# Patient Record
Sex: Male | Born: 1970 | Race: White | Hispanic: No | Marital: Married | Smoking: Never smoker
Health system: Southern US, Community
[De-identification: ages and names within clinical notes are randomized; demographics above are authoritative.]

## PROBLEM LIST (undated history)

## (undated) DIAGNOSIS — J302 Other seasonal allergic rhinitis: Secondary | ICD-10-CM

## (undated) DIAGNOSIS — K4090 Unilateral inguinal hernia, without obstruction or gangrene, not specified as recurrent: Secondary | ICD-10-CM

## (undated) DIAGNOSIS — E039 Hypothyroidism, unspecified: Secondary | ICD-10-CM

## (undated) DIAGNOSIS — E271 Primary adrenocortical insufficiency: Secondary | ICD-10-CM

## (undated) DIAGNOSIS — F419 Anxiety disorder, unspecified: Secondary | ICD-10-CM

## (undated) DIAGNOSIS — K219 Gastro-esophageal reflux disease without esophagitis: Secondary | ICD-10-CM

## (undated) HISTORY — DX: Primary adrenocortical insufficiency: E27.1

## (undated) HISTORY — DX: Hypothyroidism, unspecified: E03.9

## (undated) HISTORY — DX: Gastro-esophageal reflux disease without esophagitis: K21.9

## (undated) HISTORY — PX: RHINOPLASTY: SHX2354

---

## 2002-12-01 ENCOUNTER — Inpatient Hospital Stay (HOSPITAL_COMMUNITY): Admission: EM | Admit: 2002-12-01 | Discharge: 2002-12-09 | Payer: Self-pay | Admitting: Emergency Medicine

## 2002-12-01 ENCOUNTER — Encounter: Payer: Self-pay | Admitting: Internal Medicine

## 2002-12-01 ENCOUNTER — Encounter: Payer: Self-pay | Admitting: Emergency Medicine

## 2002-12-02 ENCOUNTER — Encounter: Payer: Self-pay | Admitting: Internal Medicine

## 2002-12-03 ENCOUNTER — Encounter: Payer: Self-pay | Admitting: Internal Medicine

## 2002-12-23 ENCOUNTER — Encounter: Admission: RE | Admit: 2002-12-23 | Discharge: 2002-12-23 | Payer: Self-pay | Admitting: Internal Medicine

## 2003-01-08 ENCOUNTER — Ambulatory Visit (HOSPITAL_COMMUNITY): Admission: RE | Admit: 2003-01-08 | Discharge: 2003-01-08 | Payer: Self-pay | Admitting: Internal Medicine

## 2003-01-14 ENCOUNTER — Encounter: Payer: Self-pay | Admitting: Internal Medicine

## 2003-01-14 ENCOUNTER — Ambulatory Visit (HOSPITAL_COMMUNITY): Admission: RE | Admit: 2003-01-14 | Discharge: 2003-01-14 | Payer: Self-pay | Admitting: Internal Medicine

## 2003-01-16 ENCOUNTER — Encounter: Admission: RE | Admit: 2003-01-16 | Discharge: 2003-01-16 | Payer: Self-pay | Admitting: Internal Medicine

## 2003-01-23 ENCOUNTER — Encounter: Admission: RE | Admit: 2003-01-23 | Discharge: 2003-01-23 | Payer: Self-pay | Admitting: Internal Medicine

## 2004-12-10 ENCOUNTER — Encounter (INDEPENDENT_AMBULATORY_CARE_PROVIDER_SITE_OTHER): Payer: Self-pay | Admitting: *Deleted

## 2004-12-10 ENCOUNTER — Ambulatory Visit (HOSPITAL_BASED_OUTPATIENT_CLINIC_OR_DEPARTMENT_OTHER): Admission: RE | Admit: 2004-12-10 | Discharge: 2004-12-10 | Payer: Self-pay | Admitting: *Deleted

## 2004-12-10 ENCOUNTER — Ambulatory Visit (HOSPITAL_COMMUNITY): Admission: RE | Admit: 2004-12-10 | Discharge: 2004-12-10 | Payer: Self-pay | Admitting: *Deleted

## 2005-07-11 ENCOUNTER — Encounter: Admission: RE | Admit: 2005-07-11 | Discharge: 2005-07-11 | Payer: Self-pay | Admitting: Endocrinology

## 2006-02-04 IMAGING — CR DG HIP (WITH OR WITHOUT PELVIS) 2-3V*L*
2 series · 2 of 2 positions shown · non-contrast
Comparison: none

CLINICAL DATA: Lumbar spine pain.   Hip pain.   
 LUMBAR SPINE ? 4 VIEW:

[t hip ap right]
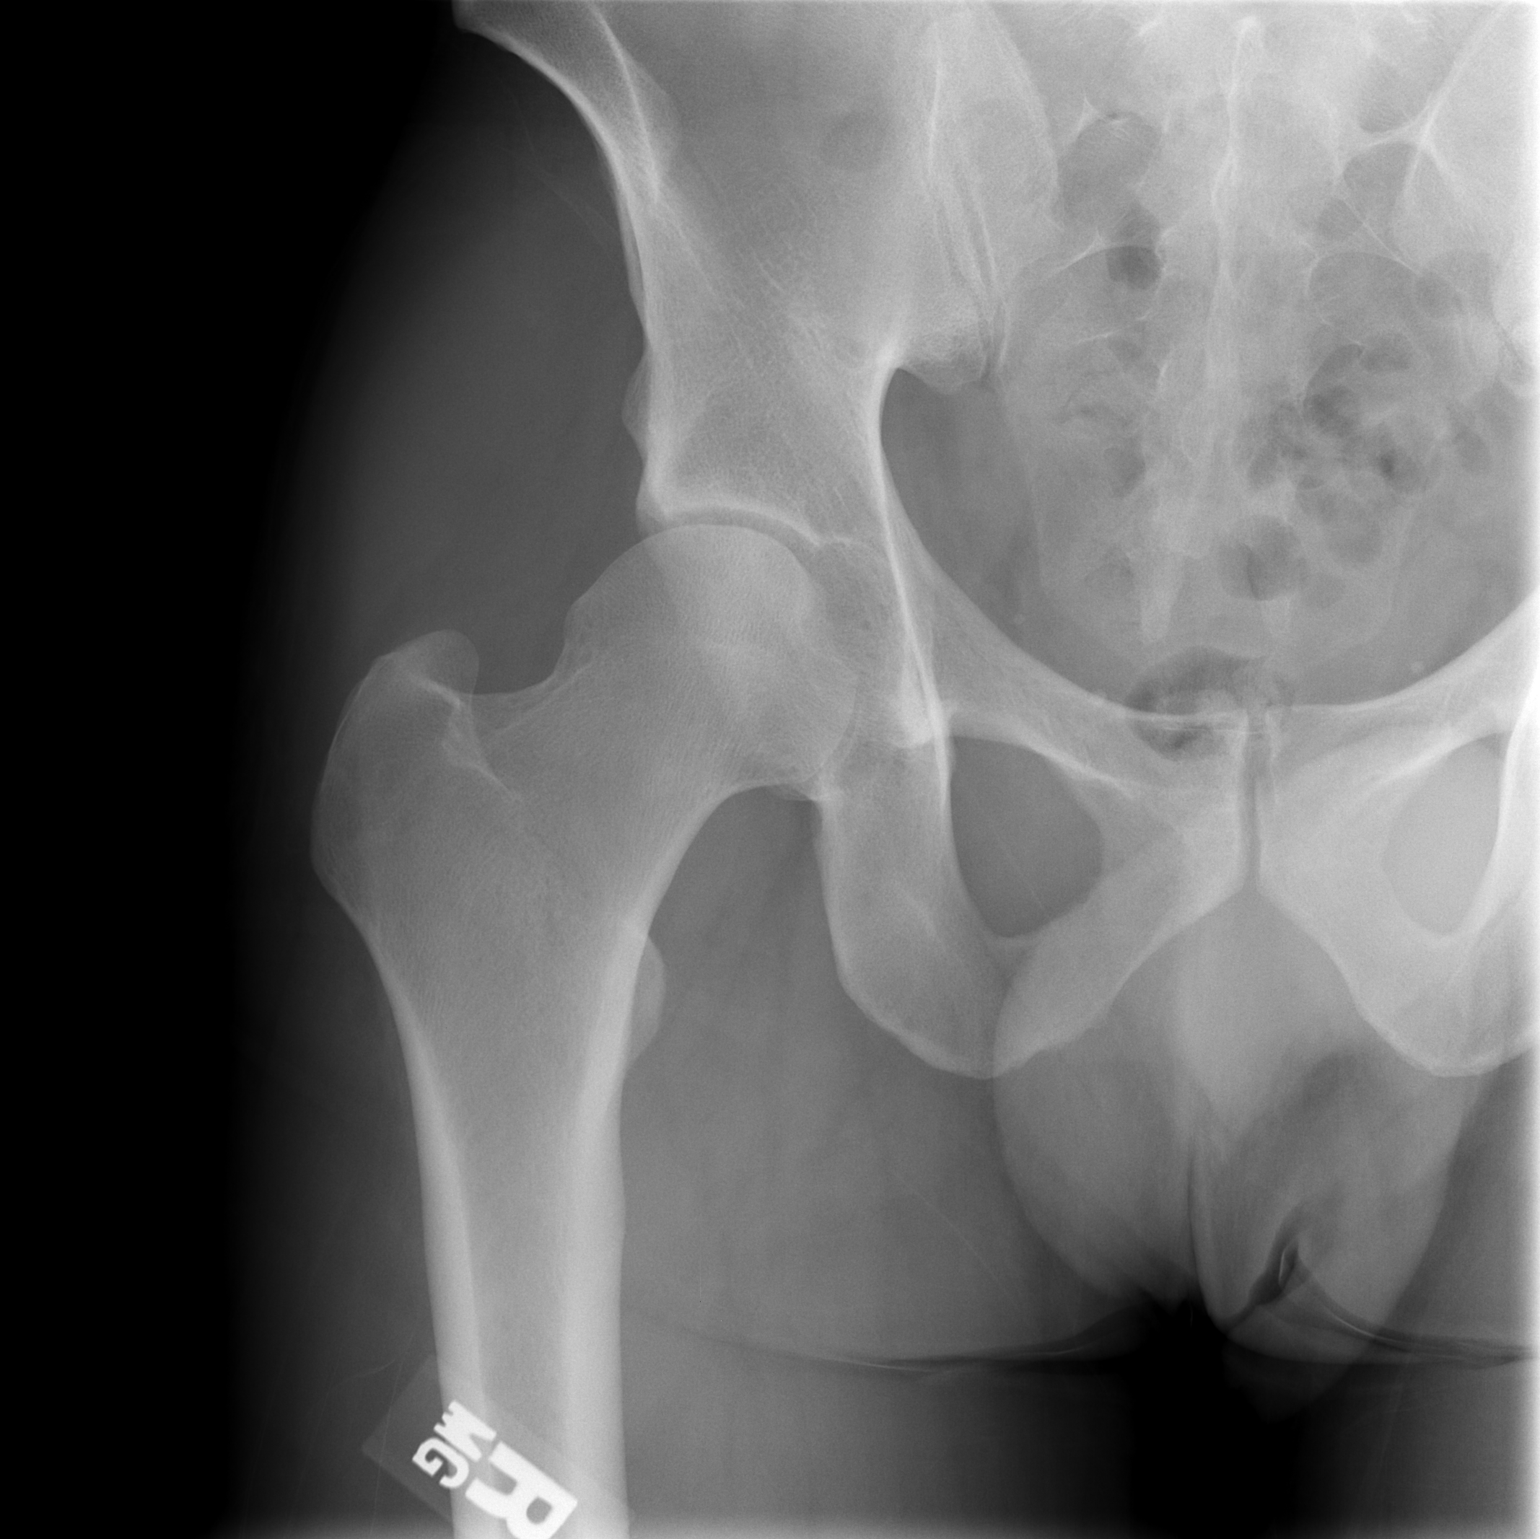

[t hip frog leg right]
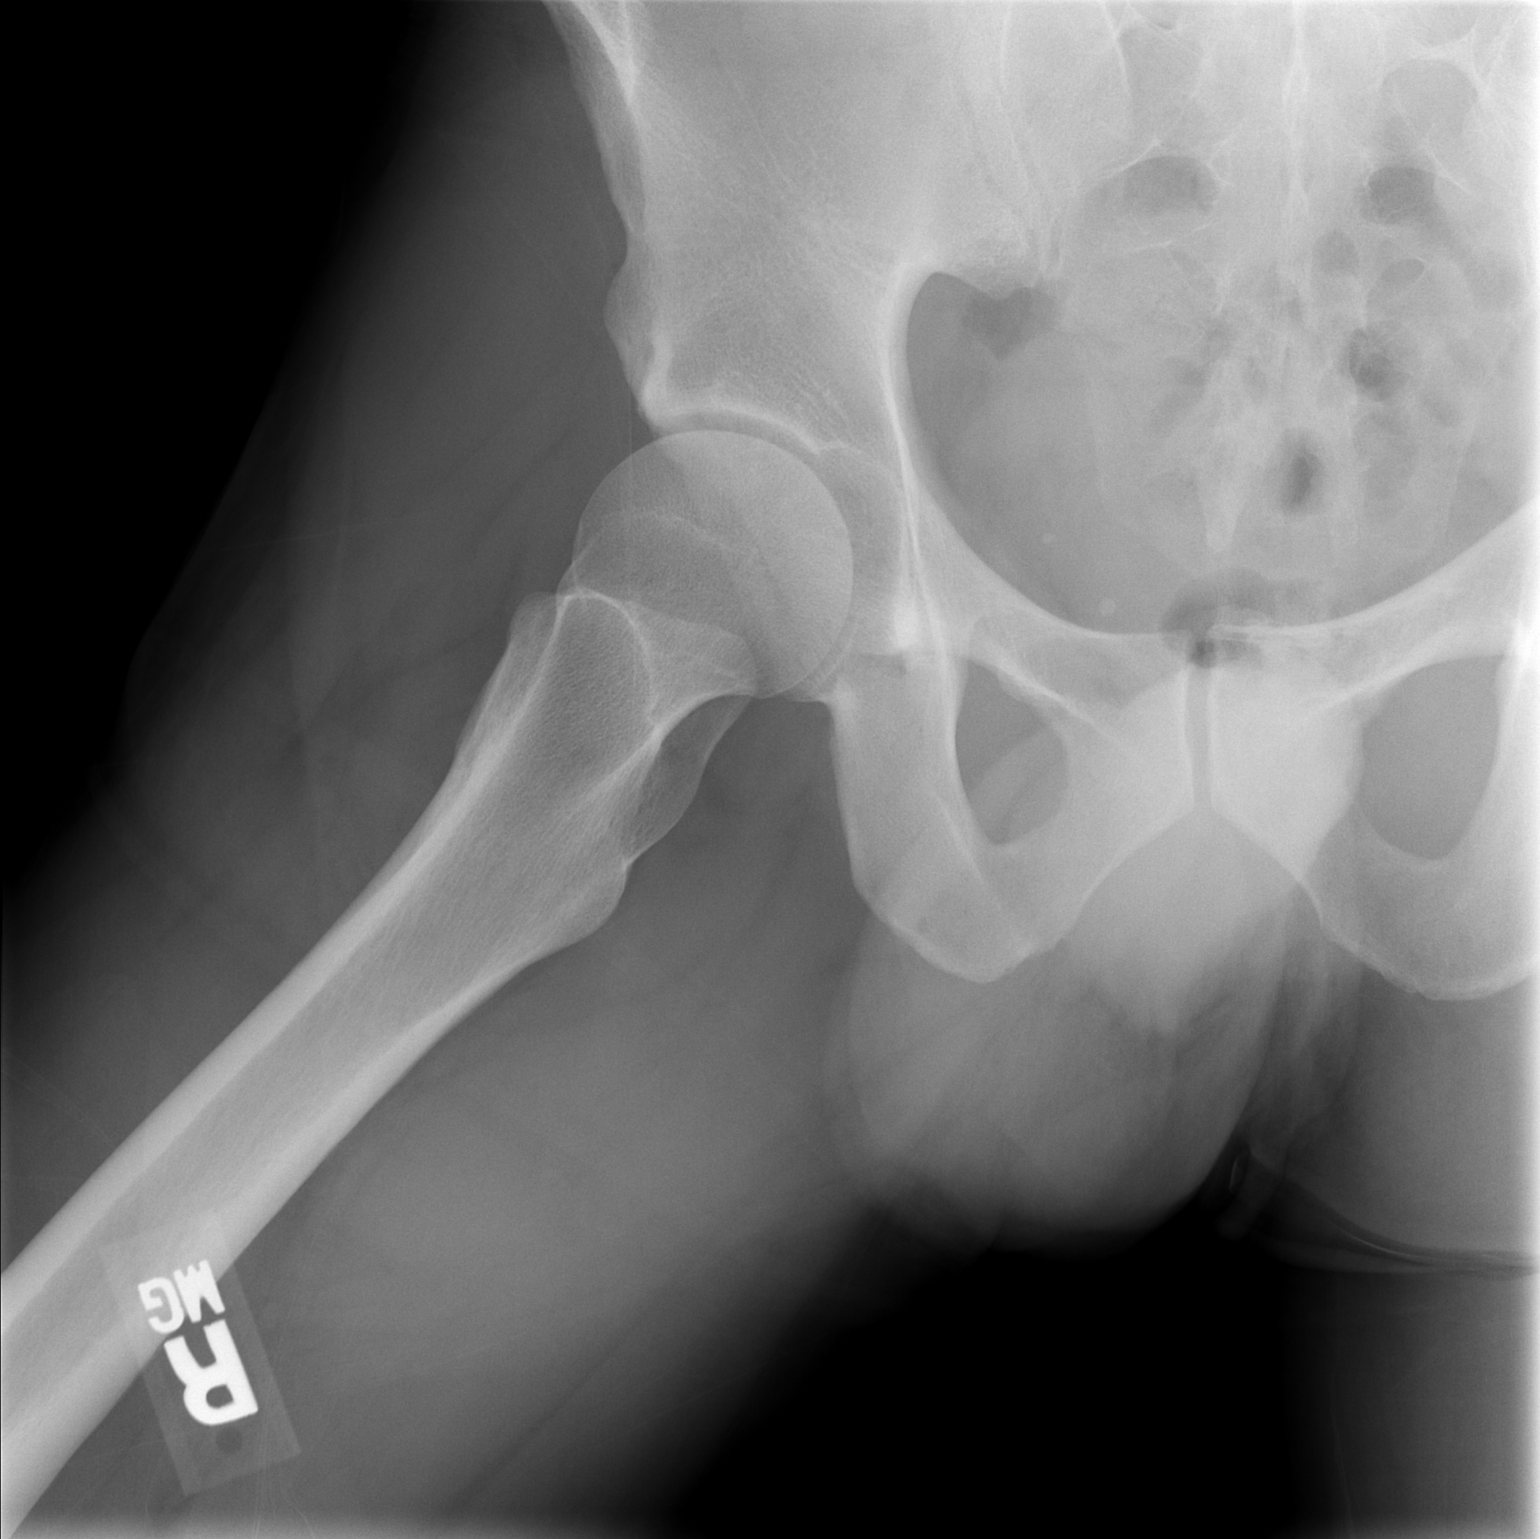

[2 of 2 positions shown; findings below may reference images not displayed]

FINDINGS: The lumbar vertebrae are in normal alignment with normal intervertebral disc spaces.  No compression deformity is seen.  The SI joints appear normal.
IMPRESSION: Negative lumbar spine.  Normal alignment. 
 LEFT HIP ? 2 VIEW:
FINDINGS: Two views of the left hip show no acute abnormality.  The hip joint space is normal and the ramus is intact.
IMPRESSION: Negative left hip.
 RIGHT HIP ? 2 VIEW:
FINDINGS: Two views of the right hip show no acute abnormality.  The joint space is within normal limits and the ramus is intact.
IMPRESSION: Negative right hip.
 PELVIS ? 1 VIEW:
FINDINGS: A single view of the pelvis shows both hips to be in normal position.  The pelvic rami are intact.  The SI joints are normal.
IMPRESSION: Negative pelvis.

## 2008-09-16 ENCOUNTER — Ambulatory Visit: Payer: Self-pay | Admitting: Internal Medicine

## 2008-09-16 ENCOUNTER — Telehealth: Payer: Self-pay | Admitting: Internal Medicine

## 2008-09-16 DIAGNOSIS — R079 Chest pain, unspecified: Secondary | ICD-10-CM

## 2008-09-16 DIAGNOSIS — R1013 Epigastric pain: Secondary | ICD-10-CM | POA: Insufficient documentation

## 2008-09-17 ENCOUNTER — Ambulatory Visit: Payer: Self-pay | Admitting: Internal Medicine

## 2009-09-30 ENCOUNTER — Encounter: Payer: Self-pay | Admitting: Internal Medicine

## 2009-10-01 ENCOUNTER — Encounter: Payer: Self-pay | Admitting: Internal Medicine

## 2009-10-02 ENCOUNTER — Ambulatory Visit: Payer: Self-pay

## 2009-10-02 ENCOUNTER — Ambulatory Visit: Payer: Self-pay | Admitting: Internal Medicine

## 2009-10-02 ENCOUNTER — Ambulatory Visit (HOSPITAL_COMMUNITY): Admission: RE | Admit: 2009-10-02 | Discharge: 2009-10-02 | Payer: Self-pay | Admitting: Internal Medicine

## 2009-10-02 DIAGNOSIS — R002 Palpitations: Secondary | ICD-10-CM | POA: Insufficient documentation

## 2009-10-15 ENCOUNTER — Ambulatory Visit: Payer: Self-pay | Admitting: Internal Medicine

## 2009-11-03 ENCOUNTER — Telehealth: Payer: Self-pay | Admitting: Internal Medicine

## 2009-12-24 ENCOUNTER — Encounter: Payer: Self-pay | Admitting: Internal Medicine

## 2010-09-09 NOTE — Letter (Signed)
Summary: Office Note  Office Note   Imported By: Roderic Ovens 10/08/2009 11:08:47  _____________________________________________________________________  External Attachment:    Type:   Image     Comment:   External Document

## 2010-09-09 NOTE — Assessment & Plan Note (Signed)
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 No h/o heart disease.   On Wednesday, driving to pick up his son. Very busy day. Had been to gym 1 hour before for light workout. Then while driving rapid onset of chest tightness down to L arm. +tachypalpitations.  Diaphoretic and mild nausea. No lightheadedness. Got home and took a dose of hydrocortisone. Brother (who is PA) came over and checked BP and it was normal. HR was just about 100. Went to [facility] and had ECG which was norm with SR 90. Chest tightness resolved fairly quickly. However, HR seemed to stay elevated. Still feel like HR is up and down. Not sure if it may be anxiety. Under stress at work has to adjust hydrocortisone to correlate to stress levels.  Very active at baseline without any CP or SOB. Does mostly resistance exercise but does do some biking. No recent long travel. No diarrhea, fevers or chills. Has been taking Zyrtec-D which he takes all the time. Has brother with panic attacks.   Saw Dr. Evlyn Kanner yesterday and had blood drawn and ECG which was normal (HR around 90-95).   Problems Prior to Update: 1)  Tachycardia  (ICD-785) 2)  Chest Pain  (ICD-786.50) 3)  Abdominal Pain, Epigastric  (ICD-789.06)  Medications Prior to Update: 1)  Hydrocortisone 20 Mg Tabs (Hydrocortisone) .... Every Morning. 2)  Hydrocortisone 10 Mg Tabs (Hydrocortisone) .... Every Evening 3)  Fludrocortisone Acetate 0.1 Mg Tabs (Fludrocortisone Acetate) .... Once Daily 4)  Zyrtec Allergy 10 Mg Tabs (Cetirizine Hcl) .... As Needed 5)  Synthroid 150 Mcg Tabs (Levothyroxine Sodium) .... Once Daily 6)  Prilosec Otc 20 Mg Tbec (Omeprazole Magnesium) .... Once Daily  Current Medications (verified): 1)  Hydrocortisone 20 Mg Tabs (Hydrocortisone) .... Every Morning. 2)  Hydrocortisone 10 Mg Tabs (Hydrocortisone) .... Every  Evening 3)  Fludrocortisone Acetate 0.1 Mg Tabs (Fludrocortisone Acetate) .... Once Daily 4)  Zyrtec Allergy 10 Mg Tabs (Cetirizine Hcl) .... As Needed 5)  Synthroid 150 Mcg Tabs (Levothyroxine Sodium) .... Once Daily  Allergies (verified): 1)  ! Augmentin  Past History:  Past Surgical History: Last updated: 09/16/2008 Rhinoplasty 1970  Family History: Last updated: 09/16/2008 No FH of Colon Cancer: Epilepsy: Brother  Social History: Last updated: 09/16/2008 Occupation: Emergency planning/management officer Patient has never smoked.  Alcohol Use - yes -occ. Daily Caffeine Use -1 Illicit Drug Use - no Patient gets regular exercise.  Past Medical History: 1. GERD 2. Hypothyroidism 3. Addison's disease dx'd in 2004    --h/o adrenal crisis in 2004  Vital Signs:  Patient profile:   40 year old male Height:      70 inches Weight:      185 pounds BMI:     26.64 Pulse rate:   92 / minute Resp:     16 per minute BP sitting:   144 / 84  (left arm)  Vitals Entered By: Marrion Coy, CNA (October 02, 2009 9:19 AM)  Physical Exam  General:  Gen: muscular. healthy appearing. well appearing. no resp difficulty HEENT: normal Neck: supple. no JVD. Carotids 2+ bilat; no bruits. No lymphadenopathy or thryomegaly appreciated. Cor: PMI nondisplaced. Regular rate & rhythm. No rubs, gallops, murmur. Lungs: clear Abdomen: soft, nontender, nondistended. No hepatosplenomegaly. No bruits or masses. Good bowel sounds. Extremities: no cyanosis, clubbing, rash, edema Neuro: alert & orientedx3, cranial nerves grossly intact. moves all  4 extremities w/o difficulty. affect pleasant    Impression & Recommendations:  Problem # 1:  CHEST TIGHTNESS-PRESSURE-OTHER ([id number]) Unclear etiology. No objective signs ischemia but may be related to tachypalpitations or possibly anxiety. Will schedule echo and treadmill test to evaluate. Start low-dose klonopin 0.25 two times a day.  Problem # 2:  PALPITATIONS  (ICD-785.1) TSH checked by Dr. Evlyn Kanner. Will check 48-our holter and echo.   Other Orders: Treadmill (Treadmill) Holter Monitor (Holter Monitor)  Patient Instructions: 1)  Your physician has requested that you have an exercise tolerance test.  For further information please visit [email or web address]  Please also follow instruction sheet, as given. 2)  Your physician has recommended that you wear a holter monitor.  Holter monitors are medical devices that record the heart's electrical activity. Doctors most often use these monitors to diagnose arrhythmias. Arrhythmias are problems with the speed or rhythm of the heartbeat. The monitor is a small, portable device. You can wear one while you do your normal daily activities. This is usually used to diagnose what is causing palpitations/syncope (passing out). 3)  Follow up in 1 month Prescriptions: KLONOPIN 0.5 MG TABS (CLONAZEPAM) 1/2 tab two times a day  #30 x 3   Entered by:   Meredith Staggers, RN   Authorized by:   Dolores Patty, MD, [facility]   Signed by:   Meredith Staggers, RN on 10/02/2009   Method used:   Print then Give to Patient   RxID:   [id number]

## 2010-09-09 NOTE — Letter (Signed)
Summary: cx'd stress test  [facility], Main Office  [address]   [place], [place] [address]   Phone: [phone]  Fax: [phone]        Dec 24, 2009 MRN: [id number]    Mark Christian [address] [place], [place]  [address]    Dear Mark Christian,  Our records indicate that you have cancelled your stress test.  It is Dr Treyson Axel's recommendation that you have this test done.  Please contact our office as soon as possible to reschedule.     Sincerely,  Meredith Staggers, RN Arvilla Meres, MD  This letter has been electronically signed by your physician.

## 2010-09-09 NOTE — Progress Notes (Signed)
Summary: pt calling for holter results  Phone Note Call from Patient Call back at Home Phone [phone]   Caller: PT Reason for Call: Lab or Test Results Summary of Call: PT calling for holter monitor results. Initial call taken by: Faythe Ghee,  November 03, 2009 10:02 AM  Follow-up for Phone Call        pt given results, SR w/occ. PAC/PVC's Meredith Staggers, RN  November 03, 2009 2:05 PM

## 2010-10-14 ENCOUNTER — Other Ambulatory Visit: Payer: Self-pay | Admitting: Endocrinology

## 2010-10-14 DIAGNOSIS — R599 Enlarged lymph nodes, unspecified: Secondary | ICD-10-CM

## 2010-10-15 ENCOUNTER — Ambulatory Visit
Admission: RE | Admit: 2010-10-15 | Discharge: 2010-10-15 | Disposition: A | Discharge status: Home / Self Care | Payer: 59 | Source: Ambulatory Visit | Attending: Endocrinology | Admitting: Endocrinology

## 2010-10-15 DIAGNOSIS — R599 Enlarged lymph nodes, unspecified: Secondary | ICD-10-CM

## 2010-10-15 MED ORDER — IOHEXOL 300 MG/ML  SOLN
100.0000 mL | Freq: Once | INTRAMUSCULAR | Status: AC | PRN
  Administered 2010-10-15: 100 mL via INTRAVENOUS

## 2010-12-24 NOTE — Op Note (Signed)
Mark Christian, Mark Christian NO.:  [id number]   MEDICAL RECORD NO.:  [id number]          PATIENT TYPE:  AMB   LOCATION:  DSC                          FACILITY:  [facility]   PHYSICIAN:  Kathy Breach, M.D.      DATE OF BIRTH:  [birth year]   DATE OF PROCEDURE:  12/10/2004  DATE OF DISCHARGE:                                 OPERATIVE REPORT   PREOPERATIVE DIAGNOSES:  1.  Recurrent and chronic bilateral maxillary sinusitis.  2.  Addison's disease.   POSTOPERATIVE DIAGNOSES:  1.  Recurrent and chronic bilateral maxillary sinusitis.  2.  Addison's disease.   PROCEDURES:  1.  Bilateral anterior ethmoidectomy with osteomeatal windows.  2.  Endoscopic removal right concha bullosa cell.   DESCRIPTION OF PROCEDURE:  Attempting the procedure under local anesthesia  with monitored anesthesia coverage.  Patient being sedated, was unable to  remain adequately still after early nasal anesthesia had been applied with  4% Xylocaine, ephedrine soaked probes to the sphenopalatine and anterior  ethmoid nerve areas bilaterally.  Anesthesia was converted to general  orotracheal anesthesia under the direction of the anesthesiologist.  With  patient under general orotracheal anesthesia, probes to the sphenopalatine  and anterior ethmoid nerve areas soaked with 4% Xylocaine, 1:100,000  epinephrine solution again placed.  Cotton pledgets soaked in similar  solution were inserted along the middle and inferior turbinates bilaterally.  Mucosa of middle meatus and meatal surface of the inferior turbinate  infiltrated with 1% Xylocaine with 1:100,000 epinephrine.  Patient had  moderate deflection of the nasal septum to the left in the middle meatal  area narrowing the area but not obstructing the significant deviation.  Small middle turbinate identified the infundibulum, identified sickle cell  made to incise along the inferior leading edge of the infundibulum which was  removed.  __________ and  visible prior to removing the infundibulum was  prominent bulla ethmoidalis cell which was taken down with straight Weil  forceps.  Once the bulla ethmoidalis cell was removed, the area of the  middle meatal ostium could be identified by palpation with curved suction.  A definite opening in the ostium was not visible.  A window was then made  through the ostial meatal.  A good 1 cm window into the left maxillary  sinus.  Somewhat thickened mucosa of the area but placing the curved suction  deep into the sinus, no returns obtained.  On the left side, it encountered  as a prominent middle turbinate with a known conchal bullosa cell.  Sickle  cell inserted into the concha bullosa cell and the lower 180 degrees  excised.  The meatal surface of the concha bullosa cell removed  marsupializing the cell.  Again, as on the opposite side, incision was made  along the inferior and leading edge of the infundibulum which was removed.  Again, a very prominent concha bullosa cell was taken down with Weil  forceps.  The area of the ostium, again no definite ostium identified but by  palpation and curved suction, the membranous area was identified and a  window again around 1  cm, made into the right maxillary sinus.  Placing the  curved suction into the depth of the sinus, again no return at this time  received for any cultures.  Cotton pledgets inserted in the middle meatal  area bilaterally for a few minutes and removed.  There was no  significant bleeding.  Nasopharynx and oropharynx were suctioned clear of  scant amount of blood.  Blood loss for the procedure was less than 30 to 50  mL.  The patient tolerated the procedure well and was taken to the recovery  room in stable general condition.      JGL/MEDQ  D:  12/10/2004  T:  12/10/2004  Job:  [id number]

## 2010-12-24 NOTE — Discharge Summary (Signed)
NAMELOYED, WILMES NO.:  [id number]   MEDICAL RECORD NO.:  [id number]                   PATIENT TYPE:  INP   LOCATION:  5740                                 FACILITY:  [facility]   PHYSICIAN:  Rodolph Bong, M.D.                  DATE OF BIRTH:  [birth year]   DATE OF ADMISSION:  12/01/2002  DATE OF DISCHARGE:  12/09/2002                                 DISCHARGE SUMMARY   DISCHARGE DIAGNOSES:  1. Sepsis.  2. Anemia.   DISCHARGE MEDICATIONS:  1. Hydrocortisone 5 mg.  The patient was advised to take two tablets in the     morning and then one tablet the following day to complete the course.  2. Hydrochlorothiazide (HCTZ) 12.5 mg p.o. q.d. while taking hydrocortisone.  3. Doxycycline 100 mg p.o. q. 12h x 3 days.  4. Tequin 400 mg p.o. q.d. x 3 days.  5. Protonix 40 mg p.o. q.d.   BRIEF ADMISSION HISTORY:  This is a 40 year old white male who presents via  EMS, status post an acute episode of coughing up black tarry substance this  morning with subsequent altered mental status.  Wife states he seemed to be  fine until the a.m. of November 30, 2002, then he began having several episodes  of nausea, vomiting, and diarrhea.  Described large volume of diarrhea, but  no blood.  His family member had some Phenergan on hand, so he took two  tablets which alleviated symptoms yesterday.  His wife awoke this morning  and found Mr. Koike vomiting a black tarry substance.  At that point in  time he became disoriented, did not recognize family members, etc.  EMS was  called and transported him to [facility] Emergency Department.  The ED staff  noted the patient to be cool to touch, cyanotic nailbed, and sluggish  pupils.  His initial vitals showed a temperature of 104.7, blood pressure  60/30, heart rate of 163.  He was given I.V. fluids wide open and Zosyn  empirically.  We were called to admit and further evaluate Mr. Murchison.   BRIEF HOSPITAL COURSE:  1. Sepsis.   Mr. Tarnowski presents with a history as outlined above.  He was in     septic shock with systemic inflammatory response upon admission.     Significant laboratory data revealed a creatinine of 3.9 with a BUN of     39.  In addition, his INR was elevated at 1.7.  His PT was slightly     elevated at 38.  He was admitted and switched to vancomycin and Rocephin     for empiric antibiotic coverage.  In addition, LP, blood, and urine     cultures were obtained.  In addition shortly thereafter, doxycycline was     added to his antibiotic regimen for concern over the possible MRSA.     While in the hospital,  Mr. Teicher experienced a stormy first 24 hours.     He required a significant amount of I.V. hydration to stabilize him and     aggressive supportive care.  In addition, hydrocortisone therapy was     instituted after his cortisol level returned significantly low.  He     required transfusion of FFP to further stabilize his coagulation factors     for the LP attempt.  After the first 24 hours, Mr. Balthazor began to     gradually improve.  No clear etiology was identified on his laboratory     data.  This might have been confusing since he had received broad     spectrum antibiotics before many of his laboratory draws were performed.     However, Mr. Schauf was discharged on the medication listed above to     further complete his acute life threatening illness.  2. Adrenal insufficiency.  Mr. Delair was found to be adrenally insufficient     with a severely low cortisol level.  I have no exact answer for this.     Reason cannot be ascertained either.  Could possibly be due to severe GI     depletion, but it is still unclear.  Subsequently, he will need further     evaluation of this in the future.  3. Anemia.  The patient did have a history of heme positive stools upon     admission.  His hemoglobin eventually stabilized around the 10 to 11     range and that can be followed up further as an  outpatient.   ACTIVITY:  Recommended office work for the first two weeks while he is back  at work.   SPECIAL INSTRUCTIONS:  The patient will need a spot cortisol checked by his  primary medical physician.   FOLLOWUP DATE:  1. Dr. Delilah Shan, outpatient medicine clinic on May 17th at 2 p.m.  2. Dr. Caroline More.  The patient was advised to call him for schedule of     physical.                                               Rodolph Bong, M.D.    AK/MEDQ  D:  01/07/2003  T:  01/07/2003  Job:  [id number]

## 2010-12-24 NOTE — Discharge Summary (Signed)
Mark Christian, COLL NO.:  [id number]   MEDICAL RECORD NO.:  [id number]                   PATIENT TYPE:  INP   LOCATION:  5740                                 FACILITY:  [facility]   PHYSICIAN:  Dineen Kid. Reche Dixon, M.D.               DATE OF BIRTH:  September 11, 1970   DATE OF ADMISSION:  12/01/2002  DATE OF DISCHARGE:  12/09/2002                                 DISCHARGE SUMMARY   DISCHARGE DIAGNOSES:  1. Septic shock with systemic inflammatory response syndrome.  2. Adrenal insufficiency.  3. Acute renal failure.  4. Disseminated intravascular coagulation.  5. Heme positive stools.   DISCHARGE MEDICATIONS:  1. Hydrocortisone 5 mg. The patient was instructed to take two tablets in     the morning and one tablet the following day to complete his course.  2. Hydrochlorothiazide 12.5 mg p.o. q.d. while on hydrocortisone.  3. Doxycycline 100 mg p.o. b.i.d. for three days.  4. Tequin 400 mg p.o. q.d. for three days.  5. Protonix 40 mg p.o. q.d.   BRIEF ADMISSION HISTORY:  This is a 40 year old white male with no  significant past medical history who presents by EMS status post an acute  episode of coughing up black tarry substances early that morning. His wife  noticed subsequent altered mental status after this event. She stated that  he seemed fine until the morning prior to presentation. That day, he began  having multiple episodes of nausea, vomiting, and diarrhea. She stated he  was having large volumes of diarrhea but no blood at that time. One of his  family members had Phenergan which he took two tablets which alleviated his  symptoms yesterday. His wife awoke this morning to find Mark Christian vomiting  a black tarry substance. At that point in time, he became disoriented. He  did not recognize his wife and other family members, etc. EMS was called,  and he was transported to [facility] emergency department for further  evaluation. EMS noted on this  transport that he was cool to touch, had  cyanotic nailbeds, and sluggish pupils. Upon arrival to the emergency  department, he was found to be febrile at 104.7. His BP was substantially  low at 60/30, his heart rate 163. At that time, we were called to further  evaluate.   BRIEF HOSPITAL COURSE:  1. Septic shock with systemic inflammatory response syndrome. The patient     was admitted with history as outlined above. As mentioned above, his     initial vital signs revealed a temperature of 104.7, heart rate of 163,     blood pressure of 60/30. On physical exam, he was found to be lethargic     and arousable to verbal stimuli. His chest was clear, but he was     tachypneic. He was tachycardic with a regular rhythm. He was found to     have cool  extremities. No cyanosis. He was trace heme positive by the     emergency department physician. No focal neurologic abnormalities were     identified. Initial laboratory data was significant for a white count of     12.2, hemoglobin 17.2, elevated creatinine of 3.9 with a BUN of 39. He     was also found to have an elevated INR at 1.7 with a PT of 18.6. His     LFTs, amylase, and lipase were within normal limits. CT scan was done     which revealed few small hyperdense lesions of the gray/white junction     consistent with questionable cerebritis versus septic emboli.     Consequently, Mark Christian was admitted with an extensive differential     diagnosis which consisted with viral versus bacterial meningitis, [facility] Spotted Fever, adrenal insufficiency, among other etiologies. He     was treated with vancomycin, Rocephin, doxycycline, and aggressive IV     fluid hydration and supplemental O2. Xigris was held secondary to heme     positive stools. In order to further evaluate Mark Christian, we needed     lumbar puncture. He was given 2 units of fresh frozen plasma, and a LP     was conducted under radiologic guidance. An addition, a MRI of  the brain     was conducted when Mark Christian stabilized somewhat to further evaluate     these hyperdense lesions on CT. Essentially, the LP and MRI studies were     negative. Mark Christian experienced a very turbulent first 24 hours.     Hemodynamically, he required approximately 10 liters of IV fluid     resuscitation to maintain adequate vital signs. His orientation began to     improve as he was aggressively rehydrated. A random cortisol level was     checked which was very low at 1.1. He was begun on IV hydrocortisone 100     mg q.8h. Mark Christian was reexamined the morning of December 02, 2002 at which     time septic shock appeared to have stabilized. He was originally noted to     have disseminated intravascular coagulation which was also stabilized at     this point in time. He did have a metabolic acidosis which became non-gap     and most likely secondary to renal insufficiency and probably due to his     massive dose of sodium chloride. Etiologies that were at this time     considered to be most likely included bacterial enteritis as Mark Christian     began experiencing some frequent black and green diarrhea. Other sources     included cholecystitis which was a potential reading on his MRI and CT     scan and less likely [facility] Spotted Fever. On the morning of     April 27, Mark Christian continued to improve. He is having only mild     abdominal pain and frequent loose bowel movements. Hemodynamically, he is     stabilized, and we slightly backed down on his fluid hydration, but he     was still positive 4 liters for that day. Blood cultures which were     obtained were still negative at this present time. His creatinine     improved enough for [place] to obtain a CT of the abdomen and pelvis. The CT     scan revealed no acute intra-abdominal processes. At this point  in time,     to recap, his urine culture was negative, stool culture was negative for    one day, blood culture was negative  to date, and CSF culture was negative     for two days. In addition, there were no specific indications of     cholecystitis or cholangitis. On the morning of April 29, Mark Christian     continued to improve. He was hemodynamically stable. Antibiotics     consisting of vancomycin, Zosyn, and doxycycline and hydrocortisone were     still continued at this point in time. His RMSF antibody returned     negative. Later that day as Mark Christian seemed to be clinically     responding, it was decided that he should stop the vancomycin and begin     tapering down of his hydrocortisone. He was continued empirically on his     Zosyn and doxycycline. His IV fluids were held, and he was allowed to     ambulate. On the morning of April 30, Mr. Waynick continued to improve and     felt well. He is tolerating some p.o. intake and reported mild amount of     blood in his stool. He was hemodynamically stable. On May 1, Mark Christian     was doing well. His blood cultures returned for the final day and were     negative. Throughout the remainder of his hospital stay, Mark Christian     continued to do well. He was switched from his IV antibiotic therapy to a     normal diet, and ambulated without complaints on the day of discharge. He     was discharged for close followup on Dec 09, 2002.  2. Adrenal insufficiency. As noted in the prior section, Mark Christian was     found to have a random cortisol level of 1.1 and began on hydrocortisone     therapy while in the hospital. Unsure whether his renal insufficiency was     secondary to #1 or a primary etiology. Consequently, he was slowly     tapered on his hydrocortisone while in hospital, and this should be     further evaluated as Mark Christian responds clinically as an outpatient.  3. Acute renal failure, likely secondary to #1. With adequate fluid     hydration, steroid therapy, and antibiotics, Mark Christian continued to     improve. His acute renal failure resolved as he was  rehydrated. Initially     his creatinine was 3.9 but had returned to normal at 1.0 on the day of     discharge.   SIGNIFICANT LABORATORY DATA:  Serum IgG of 875 and serum IgM 66, both within  normal limits. Ferritin of 102. Random cortisol level on April 25 at 5:30  p.m. of 1.1. Initial alcohol level less than 5. Urine drug screen negative.  CSF protein of 32, glucose of 52. CSF cultures negative. Blood cultures  negative x2. Urine culture negative. Stool culture negative x3 days. Viral  cultures of CSF were negative for RMSF. IgG level of 0.2 which was negative.   STUDIES:  1. April 25, abdominal ultrasound, negative for gallstones, echogenic bile     ducts with question of cholangitis.  2. April 25, MRI of the brain. There is no acute intracranial abnormality.     Only sinusitis was present.  3. CT scan of the head revealing two small focal hyperdense lesions in the     underlying  white matter of the right parietal lobe and left frontal lobe.    This was deemed to be a nonspecific finding.  4. CT of the abdomen and pelvis with contrast media on December 03, 2002. CT     scan of the abdomen revealed nonspecific inflammatory changes in the     gallbladder fossa, porta hepatis, and extending to abut the anterior     right retroperitoneum. There is no associated biliary ductal dilatation.     CT exam of the pelvis revealed free fluid in the pelvis but no focal     inflammatory process.   DIET:  No restrictions.   WOUND CARE:  Not applicable.   SPECIAL INSTRUCTIONS:  The patient will need followup cortisol levels to  further evaluate possible adrenal insufficiency.   FOLLOW UP:  1. May 17 at 2 p.m. with Dr. Delilah Shan at the outpatient medicine clinic.  2. Dr. Gae Gallop for routine exam.      Mark Christian, M.D.                        Dineen Kid Reche Dixon, M.D.    AK/MEDQ  D:  04/03/2003  T:  04/04/2003  Job:  [id number]   cc:   Paticia Stack, M.D.

## 2011-02-08 ENCOUNTER — Encounter: Payer: Self-pay | Admitting: Internal Medicine

## 2011-11-08 ENCOUNTER — Emergency Department (HOSPITAL_COMMUNITY)
Admission: EM | Admit: 2011-11-08 | Discharge: 2011-11-08 | Disposition: A | Discharge status: Home / Self Care | Payer: 59 | Attending: Emergency Medicine | Admitting: Emergency Medicine

## 2011-11-08 DIAGNOSIS — R112 Nausea with vomiting, unspecified: Secondary | ICD-10-CM | POA: Insufficient documentation

## 2011-11-08 DIAGNOSIS — R000 Tachycardia, unspecified: Secondary | ICD-10-CM | POA: Insufficient documentation

## 2011-11-08 DIAGNOSIS — R197 Diarrhea, unspecified: Secondary | ICD-10-CM | POA: Insufficient documentation

## 2011-11-08 DIAGNOSIS — E2749 Other adrenocortical insufficiency: Secondary | ICD-10-CM | POA: Insufficient documentation

## 2011-11-08 DIAGNOSIS — Z79899 Other long term (current) drug therapy: Secondary | ICD-10-CM | POA: Insufficient documentation

## 2011-11-08 DIAGNOSIS — E039 Hypothyroidism, unspecified: Secondary | ICD-10-CM | POA: Insufficient documentation

## 2011-11-08 DIAGNOSIS — K219 Gastro-esophageal reflux disease without esophagitis: Secondary | ICD-10-CM | POA: Insufficient documentation

## 2011-11-08 DIAGNOSIS — R109 Unspecified abdominal pain: Secondary | ICD-10-CM | POA: Insufficient documentation

## 2011-11-08 LAB — URINALYSIS, ROUTINE W REFLEX MICROSCOPIC
Bilirubin Urine: NEGATIVE
Glucose, UA: NEGATIVE mg/dL
Hgb urine dipstick: NEGATIVE
Ketones, ur: NEGATIVE mg/dL
Nitrite: NEGATIVE
Protein, ur: NEGATIVE mg/dL
Specific Gravity, Urine: 1.027 (ref 1.005–1.030)
Urobilinogen, UA: 0.2 mg/dL (ref 0.0–1.0)
pH: 5 (ref 5.0–8.0)

## 2011-11-08 LAB — CBC
HCT: 49.6 % (ref 39.0–52.0)
Hemoglobin: 16.4 g/dL (ref 13.0–17.0)
MCH: 26.3 pg (ref 26.0–34.0)
MCHC: 33.1 g/dL (ref 30.0–36.0)
MCV: 79.5 fL (ref 78.0–100.0)
Platelets: 235 10*3/uL (ref 150–400)
RBC: 6.24 MIL/uL — ABNORMAL HIGH (ref 4.22–5.81)
RDW: 14 % (ref 11.5–15.5)
WBC: 12 10*3/uL — ABNORMAL HIGH (ref 4.0–10.5)

## 2011-11-08 LAB — BASIC METABOLIC PANEL
BUN: 22 mg/dL (ref 6–23)
CO2: 27 mEq/L (ref 19–32)
Calcium: 9.2 mg/dL (ref 8.4–10.5)
Chloride: 101 mEq/L (ref 96–112)
Creatinine, Ser: 1.14 mg/dL (ref 0.50–1.35)
GFR calc Af Amer: >90 mL/min (ref >90)
GFR calc non Af Amer: 80 mL/min — ABNORMAL LOW (ref >90)
Glucose, Bld: 93 mg/dL (ref 70–99)
Potassium: 3.5 mEq/L (ref 3.5–5.1)
Sodium: 140 mEq/L (ref 135–145)

## 2011-11-08 LAB — URINE MICROSCOPIC-ADD ON

## 2011-11-08 LAB — CORTISOL: Cortisol, Plasma: 2.1 ug/dL

## 2011-11-08 MED ORDER — ONDANSETRON HCL 4 MG/2ML IJ SOLN
4.0000 mg | Freq: Once | INTRAMUSCULAR | Status: AC
  Administered 2011-11-08: 4 mg via INTRAVENOUS
  Filled 2011-11-08: qty 2

## 2011-11-08 MED ORDER — OXYCODONE-ACETAMINOPHEN 5-325 MG PO TABS: 1.0000 | ORAL_TABLET | ORAL | Status: AC | PRN

## 2011-11-08 MED ORDER — HYDROMORPHONE HCL PF 1 MG/ML IJ SOLN
1.0000 mg | Freq: Once | INTRAMUSCULAR | Status: AC
  Administered 2011-11-08: 1 mg via INTRAVENOUS
  Filled 2011-11-08: qty 1

## 2011-11-08 MED ORDER — SODIUM CHLORIDE 0.9 % IV BOLUS (SEPSIS)
1000.0000 mL | Freq: Once | INTRAVENOUS | Status: AC
  Administered 2011-11-08: 1000 mL via INTRAVENOUS

## 2011-11-08 MED ORDER — ONDANSETRON HCL 4 MG/2ML IJ SOLN
INTRAMUSCULAR | Status: AC
  Filled 2011-11-08: qty 2

## 2011-11-08 MED ORDER — ONDANSETRON HCL 4 MG PO TABS: 4.0000 mg | ORAL_TABLET | Freq: Four times a day (QID) | ORAL | Status: AC

## 2011-11-08 MED ORDER — HYDROCORTISONE SOD SUCCINATE 100 MG IJ SOLR
100.0000 mg | Freq: Once | INTRAMUSCULAR | Status: AC
  Administered 2011-11-08: 100 mg via INTRAVENOUS
  Filled 2011-11-08: qty 2

## 2011-11-08 NOTE — Discharge Instructions (Signed)
Abdominal Pain Abdominal pain can be caused by many things. Your caregiver decides the seriousness of your pain by an examination and possibly blood tests and X-rays. Many cases can be observed and treated at home. Most abdominal pain is not caused by a disease and will probably improve without treatment. However, in many cases, more time must pass before a clear cause of the pain can be found. Before that point, it may not be known if you need more testing, or if hospitalization or surgery is needed. HOME CARE INSTRUCTIONS   Do not take laxatives unless directed by your caregiver.   Take pain medicine only as directed by your caregiver.   Only take over-the-counter or prescription medicines for pain, discomfort, or fever as directed by your caregiver.   Try a clear liquid diet (broth, tea, or water) for as long as directed by your caregiver. Slowly move to a bland diet as tolerated.  SEEK IMMEDIATE MEDICAL CARE IF:   The pain does not go away.   You have a fever.   You keep throwing up (vomiting).   The pain is felt only in portions of the abdomen. Pain in the right side could possibly be appendicitis. In an adult, pain in the left lower portion of the abdomen could be colitis or diverticulitis.   You pass bloody or black tarry stools.  MAKE SURE YOU:   Understand these instructions.   Will watch your condition.   Will get help right away if you are not doing well or get worse.  Document Released: 05/04/2005 Document Revised: 07/14/2011 Document Reviewed: 03/12/2008 [facility] Patient Information 2012 [place], [place].Diarrhea Diarrhea is watery poop (stool). The most common cause of diarrhea is a germ. Other causes include:  Food poisoning.   A reaction to medicine.  HOME CARE   Drink clear fluids. This can stop you from losing too much body fluid (dehydration).   Drink enough fluids to keep your pee (urine) clear or pale yellow.   Avoid solid foods and dairy products until  you start to feel better. Then start eating bland foods, such as:   Bananas.   Rice.   Crackers.   Applesauce.   Dry toast.   Avoid spicy foods, caffeine, and alcohol.   Your doctor may give medicine to help with cramps and watery poop. Take this as told. Avoid these medicines if you have a fever or blood in your poop.   Take your medicine as told. Finish them even if you start to feel better.  GET HELP RIGHT AWAY IF:   The watery poop lasts longer than 3 days.   You have a fever.   Your baby is older than 3 months with a rectal temperature of 100.5 F (38.1 C) or higher for more than 1 day.   There is blood in your poop.   You start to throw up (vomit).   You lose too much fluid.  MAKE SURE YOU:   Understand these instructions.   Will watch your condition.   Will get help right away if you are not doing well or get worse.  Document Released: 01/11/2008 Document Revised: 07/14/2011 Document Reviewed: 01/11/2008 [facility] Patient Information 2012 [place], [place].

## 2011-11-08 NOTE — ED Notes (Signed)
Brought in by EMS with c/o N/V/D; pt also c/o abdominal pain. Pt has chills on arrival.

## 2011-11-08 NOTE — ED Notes (Signed)
[id number]<AV> Expected date:<BR> Expected time:<BR> Means of arrival:<BR> Comments:<BR> N/V/ chills

## 2011-11-08 NOTE — ED Notes (Signed)
Pt called his wife for transport

## 2011-11-08 NOTE — ED Notes (Signed)
MD at bedside. Dr Kohut 

## 2011-11-08 NOTE — ED Provider Notes (Signed)
History    41 year old male presenting with abdominal pain and nausea, vomiting and diarrhea. Onset was around 2:00 this morning. Went to bed in his usual state of health. Patient has a history of Addison's disease. Is on chronic glucocorticoid replacement with hydrocortisone and mineralocorticoid replacement with fludrocortisone. Patient reports compliance with his medications with no recent changes. No fevers or chills. No urinary complaints. No sick contacts.   CSN: [id number]  Arrival date & time 11/08/11  4098   First MD Initiated Contact with Patient 11/08/11 0539      Chief Complaint  Patient presents with  . Emesis  . Diarrhea  . Chills  . Abdominal Pain    (Consider location/radiation/quality/duration/timing/severity/associated sxs/prior treatment) HPI  Past Medical History  Diagnosis Date  . GERD (gastroesophageal reflux disease)   . Hypothyroidism   . Addison's disease     dx'd in 2004. h/o adrenal crisis in 2004    Past Surgical History  Procedure Date  . Rhinoplasty 1970s     Family History  Problem Relation Age of Onset  . Colon cancer Neg Hx     History  Substance Use Topics  . Smoking status: Never Smoker   . Smokeless tobacco: Not on file  . Alcohol Use: Yes     occassional       Review of Systems   Review of symptoms negative unless otherwise noted in HPI.   Allergies  JXB:JYNWGNFAOZH+YQMVHQION+GEXBMWUXLK acid+aspartame  Home Medications   Current Outpatient Rx  Name Route Sig Dispense Refill  . CETIRIZINE HCL 10 MG PO TABS Oral Take 10 mg by mouth as needed.      Marland Kitchen FLUDROCORTISONE ACETATE PO Oral Take 0.1 mg by mouth daily. 0.1 mg tabs    . HYDROCORTISONE 10 MG PO TABS Oral Take by mouth 2 (two) times daily. Take 20 mg every am and 10 mg every evening     . LEVOTHYROXINE SODIUM 150 MCG PO TABS Oral Take 150 mcg by mouth daily.        BP 127/80  Pulse 99  Temp(Src) 98.1 F (36.7 C) (Oral)  Resp 18  SpO2 100%  Physical Exam    Nursing note and vitals reviewed. Constitutional: He appears well-developed and well-nourished.       Laying in bed. Uncomfortable appearing but not toxic.  HENT:  Head: Normocephalic and atraumatic.  Eyes: Conjunctivae are normal. Right eye exhibits no discharge. Left eye exhibits no discharge.  Neck: Neck supple.  Cardiovascular: Regular rhythm and normal heart sounds.  Exam reveals no gallop and no friction rub.   No murmur heard.      Mild tachycardia with a regular rhythm  Pulmonary/Chest: Effort normal and breath sounds normal. No respiratory distress.  Abdominal: Soft. He exhibits no distension.       Abdomen normal to inspection. No distention. Mild diffuse tenderness without guarding or rebound tenderness. No masses palpated  Musculoskeletal: He exhibits no edema and no tenderness.  Neurological: He is alert.  Skin: Skin is warm and dry. He is not diaphoretic.  Psychiatric: He has a normal mood and affect. His behavior is normal. Thought content normal.    ED Course  Procedures (including critical care time)  Labs Reviewed - No data to display No results found.   1. Abdominal pain   2. Diarrhea     6:59 AM Patient reassessed. Symptoms somewhat improved. Meds redosed.  8:26 AM Patient reassessed. Reports feeling better. Feels comfortable going home at this time. No new complaints.  Return precautions were discussed. Patient understands the need to increase his steroids for the next 3 days.  MDM  41 year old male with abdominal pain, nausea/vomiting and diarrhea. Suspect viral etiology. Consider adrenal crisis or serious bacterial illness but doubt at this time. Patient is mildly tachycardic, but he is normotensive and afebrile. Uncomfortable appearing but not toxic. Stress dose steroids. Lytes normal. Symptomatic treatment with pain medication, antiemetics and IV fluids. Discussed with pt that needs to increase steroid use for the next 3 days. Return precautions  discussed.         Raeford Razor, MD 11/10/11 [phone]

## 2011-11-08 NOTE — ED Notes (Signed)
Pt care assumed, obtained verbal report.  Pt resting comfortably.  Reports mild abd pain.

## 2016-11-29 DIAGNOSIS — G44209 Tension-type headache, unspecified, not intractable: Secondary | ICD-10-CM | POA: Diagnosis not present

## 2016-11-29 DIAGNOSIS — E271 Primary adrenocortical insufficiency: Secondary | ICD-10-CM | POA: Diagnosis not present

## 2016-11-29 DIAGNOSIS — R51 Headache: Secondary | ICD-10-CM | POA: Diagnosis not present

## 2017-01-27 DIAGNOSIS — J309 Allergic rhinitis, unspecified: Secondary | ICD-10-CM | POA: Diagnosis not present

## 2017-01-27 DIAGNOSIS — H66012 Acute suppurative otitis media with spontaneous rupture of ear drum, left ear: Secondary | ICD-10-CM | POA: Diagnosis not present

## 2017-01-27 DIAGNOSIS — H66002 Acute suppurative otitis media without spontaneous rupture of ear drum, left ear: Secondary | ICD-10-CM | POA: Diagnosis not present

## 2017-02-17 DIAGNOSIS — Z1389 Encounter for screening for other disorder: Secondary | ICD-10-CM | POA: Diagnosis not present

## 2017-04-28 DIAGNOSIS — R5381 Other malaise: Secondary | ICD-10-CM | POA: Diagnosis not present

## 2017-05-04 DIAGNOSIS — N390 Urinary tract infection, site not specified: Secondary | ICD-10-CM | POA: Diagnosis not present

## 2017-05-04 DIAGNOSIS — R8290 Unspecified abnormal findings in urine: Secondary | ICD-10-CM | POA: Diagnosis not present

## 2017-05-04 DIAGNOSIS — R10817 Generalized abdominal tenderness: Secondary | ICD-10-CM | POA: Diagnosis not present

## 2017-05-04 DIAGNOSIS — E271 Primary adrenocortical insufficiency: Secondary | ICD-10-CM | POA: Diagnosis not present

## 2017-05-04 DIAGNOSIS — K589 Irritable bowel syndrome without diarrhea: Secondary | ICD-10-CM | POA: Diagnosis not present

## 2017-05-17 DIAGNOSIS — Z23 Encounter for immunization: Secondary | ICD-10-CM | POA: Diagnosis not present

## 2017-08-05 DIAGNOSIS — J029 Acute pharyngitis, unspecified: Secondary | ICD-10-CM | POA: Diagnosis not present

## 2017-08-15 DIAGNOSIS — Z1389 Encounter for screening for other disorder: Secondary | ICD-10-CM | POA: Diagnosis not present

## 2017-10-04 DIAGNOSIS — K4090 Unilateral inguinal hernia, without obstruction or gangrene, not specified as recurrent: Secondary | ICD-10-CM | POA: Diagnosis not present

## 2017-10-04 DIAGNOSIS — Z6825 Body mass index (BMI) 25.0-25.9, adult: Secondary | ICD-10-CM | POA: Diagnosis not present

## 2017-10-05 ENCOUNTER — Other Ambulatory Visit: Payer: Self-pay | Admitting: Endocrinology

## 2017-10-05 ENCOUNTER — Other Ambulatory Visit: Payer: Self-pay | Admitting: Family Medicine

## 2017-10-05 DIAGNOSIS — K4090 Unilateral inguinal hernia, without obstruction or gangrene, not specified as recurrent: Secondary | ICD-10-CM

## 2017-10-13 ENCOUNTER — Ambulatory Visit
Admission: RE | Admit: 2017-10-13 | Discharge: 2017-10-13 | Disposition: A | Discharge status: Home / Self Care | Payer: 59 | Source: Ambulatory Visit | Attending: Endocrinology | Admitting: Endocrinology

## 2017-10-13 DIAGNOSIS — K4090 Unilateral inguinal hernia, without obstruction or gangrene, not specified as recurrent: Secondary | ICD-10-CM | POA: Diagnosis not present

## 2017-10-13 MED ORDER — IOPAMIDOL (ISOVUE-300) INJECTION 61%
100.0000 mL | Freq: Once | INTRAVENOUS | Status: AC | PRN
  Administered 2017-10-13: 100 mL via INTRAVENOUS

## 2017-10-20 ENCOUNTER — Other Ambulatory Visit: Payer: Self-pay

## 2017-11-16 DIAGNOSIS — K4090 Unilateral inguinal hernia, without obstruction or gangrene, not specified as recurrent: Secondary | ICD-10-CM | POA: Diagnosis not present

## 2017-11-20 ENCOUNTER — Other Ambulatory Visit: Payer: Self-pay | Admitting: General Surgery

## 2017-11-29 ENCOUNTER — Encounter (HOSPITAL_BASED_OUTPATIENT_CLINIC_OR_DEPARTMENT_OTHER): Payer: Self-pay | Admitting: *Deleted

## 2017-11-29 ENCOUNTER — Other Ambulatory Visit: Payer: Self-pay

## 2017-12-04 ENCOUNTER — Other Ambulatory Visit: Payer: Self-pay | Admitting: General Surgery

## 2017-12-04 MED ORDER — SODIUM CHLORIDE 0.9 % IV SOLN
80.0000 mg | Freq: Once | INTRAVENOUS | Status: DC
  Filled 2017-12-04 (×2): qty 0.64

## 2017-12-04 NOTE — Progress Notes (Signed)
Pt given hibiclens soap with instruction to shower with half night before and remaining half morning of surgery. Informed pt shower floor may become slick with soap. Pt given ensure presurgery drink per ERAS protocol. Verbal and written instruction to drink DOS by 0700. Pt verbalized understanding. Pt also met with Dr Roanna Banning re: how to take meds (hydrocortisone) on DOS. Pt's questions answered.

## 2017-12-05 NOTE — Progress Notes (Signed)
Spoke to Dr Dwain Sarna. He wants patient to have 2 doses of solumedrol 8 hours apart on DOS. Spoke to patient and informed of above. He will try to come extra early DOS and will call back when he works out arrival time.

## 2017-12-06 NOTE — Progress Notes (Signed)
Patient phoned and will arrive at 630 DOS. Pharmacy called about solumedrol order. Will need to pick up in am from pharmacy

## 2017-12-07 ENCOUNTER — Ambulatory Visit (HOSPITAL_BASED_OUTPATIENT_CLINIC_OR_DEPARTMENT_OTHER): Payer: 59 | Admitting: Anesthesiology

## 2017-12-07 ENCOUNTER — Encounter (HOSPITAL_BASED_OUTPATIENT_CLINIC_OR_DEPARTMENT_OTHER)
Admission: RE | Disposition: A | Discharge status: Home / Self Care | Payer: Self-pay | Source: Ambulatory Visit | Attending: General Surgery

## 2017-12-07 ENCOUNTER — Encounter (HOSPITAL_BASED_OUTPATIENT_CLINIC_OR_DEPARTMENT_OTHER): Payer: Self-pay | Admitting: *Deleted

## 2017-12-07 ENCOUNTER — Ambulatory Visit (HOSPITAL_BASED_OUTPATIENT_CLINIC_OR_DEPARTMENT_OTHER)
Admission: RE | Admit: 2017-12-07 | Discharge: 2017-12-07 | Disposition: A | Discharge status: Home / Self Care | Payer: 59 | Source: Ambulatory Visit | Attending: General Surgery | Admitting: General Surgery

## 2017-12-07 ENCOUNTER — Other Ambulatory Visit: Payer: Self-pay

## 2017-12-07 DIAGNOSIS — Z79890 Hormone replacement therapy (postmenopausal): Secondary | ICD-10-CM | POA: Diagnosis not present

## 2017-12-07 DIAGNOSIS — Z7952 Long term (current) use of systemic steroids: Secondary | ICD-10-CM | POA: Diagnosis not present

## 2017-12-07 DIAGNOSIS — K4090 Unilateral inguinal hernia, without obstruction or gangrene, not specified as recurrent: Secondary | ICD-10-CM | POA: Insufficient documentation

## 2017-12-07 DIAGNOSIS — E039 Hypothyroidism, unspecified: Secondary | ICD-10-CM | POA: Insufficient documentation

## 2017-12-07 DIAGNOSIS — G8918 Other acute postprocedural pain: Secondary | ICD-10-CM | POA: Diagnosis not present

## 2017-12-07 HISTORY — DX: Unilateral inguinal hernia, without obstruction or gangrene, not specified as recurrent: K40.90

## 2017-12-07 HISTORY — DX: Other seasonal allergic rhinitis: J30.2

## 2017-12-07 HISTORY — PX: INSERTION OF MESH: SHX5868

## 2017-12-07 HISTORY — PX: INGUINAL HERNIA REPAIR: SHX194

## 2017-12-07 SURGERY — REPAIR, HERNIA, INGUINAL, ADULT
Anesthesia: General | Site: Groin | Laterality: Right

## 2017-12-07 MED ORDER — LACTATED RINGERS IV SOLN
INTRAVENOUS | Status: DC
  Administered 2017-12-07 (×2): via INTRAVENOUS

## 2017-12-07 MED ORDER — PHENYLEPHRINE 40 MCG/ML (10ML) SYRINGE FOR IV PUSH (FOR BLOOD PRESSURE SUPPORT)
PREFILLED_SYRINGE | INTRAVENOUS | Status: DC | PRN
  Administered 2017-12-07 (×2): 40 ug via INTRAVENOUS

## 2017-12-07 MED ORDER — MEPERIDINE HCL 25 MG/ML IJ SOLN: 6.2500 mg | INTRAMUSCULAR | Status: DC | PRN

## 2017-12-07 MED ORDER — CHLORHEXIDINE GLUCONATE CLOTH 2 % EX PADS: 6.0000 | MEDICATED_PAD | Freq: Once | CUTANEOUS | Status: DC

## 2017-12-07 MED ORDER — METHYLPREDNISOLONE SODIUM SUCC 125 MG IJ SOLR
80.0000 mg | Freq: Once | INTRAMUSCULAR | Status: AC
  Administered 2017-12-07: 80 mg via INTRAVENOUS

## 2017-12-07 MED ORDER — ENSURE PRE-SURGERY PO LIQD: 296.0000 mL | Freq: Once | ORAL | Status: DC

## 2017-12-07 MED ORDER — PROPOFOL 10 MG/ML IV BOLUS
INTRAVENOUS | Status: DC | PRN
  Administered 2017-12-07: 150 mg via INTRAVENOUS

## 2017-12-07 MED ORDER — CIPROFLOXACIN IN D5W 400 MG/200ML IV SOLN
400.0000 mg | INTRAVENOUS | Status: AC
  Administered 2017-12-07: 400 mg via INTRAVENOUS

## 2017-12-07 MED ORDER — GABAPENTIN 300 MG PO CAPS
300.0000 mg | ORAL_CAPSULE | ORAL | Status: AC
  Administered 2017-12-07: 300 mg via ORAL

## 2017-12-07 MED ORDER — HYDROMORPHONE HCL 1 MG/ML IJ SOLN
INTRAMUSCULAR | Status: AC
  Filled 2017-12-07: qty 0.5

## 2017-12-07 MED ORDER — DEXAMETHASONE SODIUM PHOSPHATE 10 MG/ML IJ SOLN
INTRAMUSCULAR | Status: AC
  Filled 2017-12-07: qty 1

## 2017-12-07 MED ORDER — MIDAZOLAM HCL 2 MG/2ML IJ SOLN
INTRAMUSCULAR | Status: AC
  Filled 2017-12-07: qty 2

## 2017-12-07 MED ORDER — ONDANSETRON HCL 4 MG/2ML IJ SOLN
INTRAMUSCULAR | Status: DC | PRN
  Administered 2017-12-07: 4 mg via INTRAVENOUS

## 2017-12-07 MED ORDER — OXYCODONE HCL 5 MG/5ML PO SOLN
5.0000 mg | Freq: Once | ORAL | Status: DC | PRN
Start: 2017-12-07 — End: 2017-12-07

## 2017-12-07 MED ORDER — SUGAMMADEX SODIUM 200 MG/2ML IV SOLN
INTRAVENOUS | Status: DC | PRN
  Administered 2017-12-07: 200 mg via INTRAVENOUS

## 2017-12-07 MED ORDER — ONDANSETRON HCL 4 MG/2ML IJ SOLN
INTRAMUSCULAR | Status: AC
  Filled 2017-12-07: qty 2

## 2017-12-07 MED ORDER — CELECOXIB 400 MG PO CAPS
400.0000 mg | ORAL_CAPSULE | ORAL | Status: AC
  Administered 2017-12-07: 400 mg via ORAL

## 2017-12-07 MED ORDER — FENTANYL CITRATE (PF) 100 MCG/2ML IJ SOLN
INTRAMUSCULAR | Status: AC
  Filled 2017-12-07: qty 2

## 2017-12-07 MED ORDER — DEXMEDETOMIDINE HCL IN NACL 200 MCG/50ML IV SOLN
INTRAVENOUS | Status: AC
  Filled 2017-12-07: qty 100

## 2017-12-07 MED ORDER — LACTATED RINGERS IV SOLN: INTRAVENOUS | Status: DC

## 2017-12-07 MED ORDER — CIPROFLOXACIN IN D5W 400 MG/200ML IV SOLN
INTRAVENOUS | Status: AC
  Filled 2017-12-07: qty 200

## 2017-12-07 MED ORDER — PROPOFOL 10 MG/ML IV BOLUS
INTRAVENOUS | Status: AC
  Filled 2017-12-07: qty 20

## 2017-12-07 MED ORDER — ACETAMINOPHEN 500 MG PO TABS
ORAL_TABLET | ORAL | Status: AC
  Filled 2017-12-07: qty 2

## 2017-12-07 MED ORDER — CIPROFLOXACIN IN D5W 400 MG/200ML IV SOLN: 400.0000 mg | INTRAVENOUS | Status: DC

## 2017-12-07 MED ORDER — BUPIVACAINE-EPINEPHRINE (PF) 0.5% -1:200000 IJ SOLN
INTRAMUSCULAR | Status: DC | PRN
  Administered 2017-12-07: 30 mL

## 2017-12-07 MED ORDER — LIDOCAINE HCL (CARDIAC) PF 100 MG/5ML IV SOSY
PREFILLED_SYRINGE | INTRAVENOUS | Status: AC
  Filled 2017-12-07: qty 5

## 2017-12-07 MED ORDER — SUGAMMADEX SODIUM 200 MG/2ML IV SOLN
INTRAVENOUS | Status: AC
  Filled 2017-12-07: qty 2

## 2017-12-07 MED ORDER — EPHEDRINE SULFATE-NACL 50-0.9 MG/10ML-% IV SOSY
PREFILLED_SYRINGE | INTRAVENOUS | Status: DC | PRN
  Administered 2017-12-07: 10 mg via INTRAVENOUS

## 2017-12-07 MED ORDER — METHYLPREDNISOLONE SODIUM SUCC 125 MG IJ SOLR: 80.0000 mg | Freq: Once | INTRAMUSCULAR | Status: DC

## 2017-12-07 MED ORDER — LIDOCAINE 2% (20 MG/ML) 5 ML SYRINGE
INTRAMUSCULAR | Status: DC | PRN
  Administered 2017-12-07: 60 mg via INTRAVENOUS

## 2017-12-07 MED ORDER — DEXAMETHASONE SODIUM PHOSPHATE 10 MG/ML IJ SOLN
INTRAMUSCULAR | Status: DC | PRN
  Administered 2017-12-07: 5 mg via INTRAVENOUS

## 2017-12-07 MED ORDER — FENTANYL CITRATE (PF) 100 MCG/2ML IJ SOLN
50.0000 ug | INTRAMUSCULAR | Status: DC | PRN
  Administered 2017-12-07: 50 ug via INTRAVENOUS

## 2017-12-07 MED ORDER — DEXMEDETOMIDINE HCL 200 MCG/2ML IV SOLN
INTRAVENOUS | Status: DC | PRN
  Administered 2017-12-07 (×4): 4 ug via INTRAVENOUS

## 2017-12-07 MED ORDER — ROCURONIUM BROMIDE 10 MG/ML (PF) SYRINGE
PREFILLED_SYRINGE | INTRAVENOUS | Status: AC
  Filled 2017-12-07: qty 5

## 2017-12-07 MED ORDER — SCOPOLAMINE 1 MG/3DAYS TD PT72: 1.0000 | MEDICATED_PATCH | Freq: Once | TRANSDERMAL | Status: DC | PRN

## 2017-12-07 MED ORDER — CELECOXIB 200 MG PO CAPS
ORAL_CAPSULE | ORAL | Status: AC
  Filled 2017-12-07: qty 2

## 2017-12-07 MED ORDER — HYDROMORPHONE HCL 1 MG/ML IJ SOLN
0.2500 mg | INTRAMUSCULAR | Status: DC | PRN
  Administered 2017-12-07: 0.5 mg via INTRAVENOUS

## 2017-12-07 MED ORDER — GABAPENTIN 300 MG PO CAPS
ORAL_CAPSULE | ORAL | Status: AC
  Filled 2017-12-07: qty 1

## 2017-12-07 MED ORDER — OXYCODONE HCL 5 MG PO TABS: 5.0000 mg | ORAL_TABLET | Freq: Once | ORAL | Status: DC | PRN

## 2017-12-07 MED ORDER — PROMETHAZINE HCL 25 MG/ML IJ SOLN: 6.2500 mg | INTRAMUSCULAR | Status: DC | PRN

## 2017-12-07 MED ORDER — OXYCODONE-ACETAMINOPHEN 10-325 MG PO TABS: 1.0000 | ORAL_TABLET | Freq: Four times a day (QID) | ORAL | 0 refills | Status: AC | PRN

## 2017-12-07 MED ORDER — BUPIVACAINE HCL (PF) 0.25 % IJ SOLN
INTRAMUSCULAR | Status: DC | PRN
  Administered 2017-12-07: 7 mL

## 2017-12-07 MED ORDER — ACETAMINOPHEN 500 MG PO TABS
1000.0000 mg | ORAL_TABLET | ORAL | Status: AC
  Administered 2017-12-07: 1000 mg via ORAL

## 2017-12-07 MED ORDER — ROCURONIUM BROMIDE 100 MG/10ML IV SOLN
INTRAVENOUS | Status: DC | PRN
  Administered 2017-12-07: 40 mg via INTRAVENOUS

## 2017-12-07 MED ORDER — FENTANYL CITRATE (PF) 100 MCG/2ML IJ SOLN
INTRAMUSCULAR | Status: DC | PRN
  Administered 2017-12-07 (×2): 50 ug via INTRAVENOUS

## 2017-12-07 MED ORDER — MIDAZOLAM HCL 2 MG/2ML IJ SOLN
1.0000 mg | INTRAMUSCULAR | Status: DC | PRN
  Administered 2017-12-07: 2 mg via INTRAVENOUS

## 2017-12-07 SURGICAL SUPPLY — 42 items
BLADE CLIPPER SURG (BLADE) ×3 IMPLANT
BLADE SURG 15 STRL LF DISP TIS (BLADE) ×1 IMPLANT
BLADE SURG 15 STRL SS (BLADE) ×2
CHLORAPREP W/TINT 26ML (MISCELLANEOUS) ×3 IMPLANT
COVER BACK TABLE 60X90IN (DRAPES) ×3 IMPLANT
COVER MAYO STAND STRL (DRAPES) ×3 IMPLANT
DECANTER SPIKE VIAL GLASS SM (MISCELLANEOUS) IMPLANT
DERMABOND ADVANCED (GAUZE/BANDAGES/DRESSINGS) ×2
DERMABOND ADVANCED .7 DNX12 (GAUZE/BANDAGES/DRESSINGS) ×1 IMPLANT
DRAIN PENROSE 1/2X12 LTX STRL (WOUND CARE) ×3 IMPLANT
DRAPE LAPAROTOMY TRNSV 102X78 (DRAPE) ×3 IMPLANT
DRAPE UTILITY XL STRL (DRAPES) ×3 IMPLANT
ELECT COATED BLADE 2.86 ST (ELECTRODE) ×3 IMPLANT
ELECT REM PT RETURN 9FT ADLT (ELECTROSURGICAL) ×3
ELECTRODE REM PT RTRN 9FT ADLT (ELECTROSURGICAL) ×1 IMPLANT
GLOVE BIO SURGEON STRL SZ7 (GLOVE) ×3 IMPLANT
GLOVE BIOGEL PI IND STRL 7.0 (GLOVE) ×1 IMPLANT
GLOVE BIOGEL PI IND STRL 7.5 (GLOVE) ×2 IMPLANT
GLOVE BIOGEL PI INDICATOR 7.0 (GLOVE) ×2
GLOVE BIOGEL PI INDICATOR 7.5 (GLOVE) ×4
GLOVE SURG SS PI 6.5 STRL IVOR (GLOVE) ×3 IMPLANT
GOWN STRL REUS W/ TWL LRG LVL3 (GOWN DISPOSABLE) ×2 IMPLANT
GOWN STRL REUS W/TWL LRG LVL3 (GOWN DISPOSABLE) ×4
MESH ULTRAPRO 3X6 7.6X15CM (Mesh General) ×3 IMPLANT
NEEDLE HYPO 22GX1.5 SAFETY (NEEDLE) ×3 IMPLANT
NS IRRIG 1000ML POUR BTL (IV SOLUTION) ×3 IMPLANT
PACK BASIN DAY SURGERY FS (CUSTOM PROCEDURE TRAY) ×3 IMPLANT
PENCIL BUTTON HOLSTER BLD 10FT (ELECTRODE) ×3 IMPLANT
SLEEVE SCD COMPRESS KNEE MED (MISCELLANEOUS) ×3 IMPLANT
SPONGE LAP 4X18 X RAY DECT (DISPOSABLE) ×3 IMPLANT
SUT MNCRL AB 4-0 PS2 18 (SUTURE) ×3 IMPLANT
SUT SILK 2 0 SH (SUTURE) IMPLANT
SUT VIC AB 0 SH 27 (SUTURE) IMPLANT
SUT VIC AB 2-0 SH 18 (SUTURE) ×3 IMPLANT
SUT VIC AB 2-0 SH 27 (SUTURE)
SUT VIC AB 2-0 SH 27XBRD (SUTURE) IMPLANT
SUT VIC AB 3-0 SH 27 (SUTURE) ×2
SUT VIC AB 3-0 SH 27X BRD (SUTURE) ×1 IMPLANT
SUT VICRYL AB 3 0 TIES (SUTURE) IMPLANT
SYR CONTROL 10ML LL (SYRINGE) ×3 IMPLANT
TOWEL OR 17X24 6PK STRL BLUE (TOWEL DISPOSABLE) ×3 IMPLANT
TOWEL OR NON WOVEN STRL DISP B (DISPOSABLE) ×3 IMPLANT

## 2017-12-07 NOTE — Anesthesia Procedure Notes (Signed)
Procedure Name: Intubation Date/Time: 12/07/2017 10:23 AM Performed by: Marny Lowenstein, CRNA Pre-anesthesia Checklist: Patient identified, Emergency Drugs available, Suction available, Patient being monitored and Timeout performed Patient Re-evaluated:Patient Re-evaluated prior to induction Oxygen Delivery Method: Circle system utilized Preoxygenation: Pre-oxygenation with 100% oxygen Induction Type: IV induction Ventilation: Mask ventilation without difficulty Laryngoscope Size: Miller and 2 Grade View: Grade II Tube type: Oral Tube size: 8.0 mm Number of attempts: 1 Airway Equipment and Method: Patient positioned with wedge pillow Placement Confirmation: ETT inserted through vocal cords under direct vision,  positive ETCO2,  CO2 detector and breath sounds checked- equal and bilateral Secured at: 23 cm Tube secured with: Tape Dental Injury: Teeth and Oropharynx as per pre-operative assessment

## 2017-12-07 NOTE — Op Note (Signed)
Preoperative diagnosis: Right inguinal hernia Postoperative diagnosis: Indirect right inguinal hernia Procedure: Right inguinal hernia repair with ultrapro mesh patch Surgeon: Dr. Harden Mo Anesthesia: General with a tap block Estimated blood loss: Minimal Complications: None Drains: None Specimens: None Sponge needle count was correct x2 at end of operation Disposition to recovery in stable condition  Indications: This is a 47 year old male with a symptomatic right inguinal hernia.  He also has a history of Addison's disease.  We discussed options and elected proceed with an open right inguinal hernia repair with mesh.  I discussed with his endocrinologist management of his Addison's disease.  Procedure: After informed consent was obtained the patient was taken to the operating room.  He first received a tap block.  He was given antibiotics and had SCDs in place.  He was then placed under general anesthesia without complication.  His right groin was prepped and draped in the standard sterile surgical fashion.  A surgical timeout was then performed.  I infiltrated Marcaine in the skin.  I then made a right groin incision.  I carried this down to the external abdominal oblique with cautery.  I then entered this sharply.  This was entered through the external ring.  I then encircled the spermatic cord with a Penrose drain.  He did not have a direct hernia although his floor was weak.  He had a indirect hernia sac as well as a small cord lipoma.  These were dissected free from the remaining spermatic cord structures and placed back into the abdomen.  I then sutured the internal ring shot with a 2-0 Vicryl suture.  I then used an ultrapro mesh patch that was fashioned to fit his groin.  I sewed this in with 2-0 Vicryl the pubic tubercle x2.  I made a T cut and wrapped this around the spermatic cord.  I then sutured this to the inguinal ligament with 2-0 Vicryl every half centimeter.  I laid the  lateral portion flat under the external oblique.  I sutured the T cut together as well as to the muscle superiorly.  This completely obliterated the defect.  Hemostasis was observed.  I then closed the external oblique with 2-0 Vicryl suture.  Scarpa's fascia was closed with 3-0 Vicryl.  I then closed the skin with 4-0 Monocryl and applied Dermabond.  He tolerated this well was extubated and transferred to the recovery room in stable condition.

## 2017-12-07 NOTE — Anesthesia Preprocedure Evaluation (Addendum)
Anesthesia Evaluation  Patient identified by MRN, date of birth, ID band Patient awake    Reviewed: Allergy & Precautions, NPO status , Patient's Chart, lab work & pertinent test results  Airway Mallampati: I  TM Distance: >3 FB Neck ROM: Full    Dental  (+) Teeth Intact, Dental Advisory Given   Pulmonary neg pulmonary ROS,    breath sounds clear to auscultation       Cardiovascular negative cardio ROS   Rhythm:Regular Rate:Normal     Neuro/Psych negative neurological ROS  negative psych ROS   GI/Hepatic Neg liver ROS, GERD  ,  Endo/Other  Hypothyroidism   Renal/GU negative Renal ROS     Musculoskeletal negative musculoskeletal ROS (+)   Abdominal Normal abdominal exam  (+)   Peds  Hematology negative hematology ROS (+)   Anesthesia Other Findings   Reproductive/Obstetrics                            Anesthesia Physical Anesthesia Plan  ASA: II  Anesthesia Plan: General   Post-op Pain Management: GA combined w/ Regional for post-op pain   Induction: Intravenous  PONV Risk Score and Plan: 3 and Ondansetron, Dexamethasone and Midazolam  Airway Management Planned: Oral ETT  Additional Equipment: None  Intra-op Plan:   Post-operative Plan: Extubation in OR  Informed Consent: I have reviewed the patients History and Physical, chart, labs and discussed the procedure including the risks, benefits and alternatives for the proposed anesthesia with the patient or authorized representative who has indicated his/her understanding and acceptance.   Dental advisory given  Plan Discussed with: CRNA  Anesthesia Plan Comments:         Anesthesia Quick Evaluation

## 2017-12-07 NOTE — H&P (Signed)
46 yom referred by Dr Evlyn Kanner. 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 he has had some right groin pain for some time. this is worse with activity. he has increased pain in march and underwent a ct scan that shows a small rih containing fat otherwise normal. he notes a weakness but no real bulge. for some time he has noted high riding right testicle. no issues urinating or with bms. he has limited his activity due to pain and is much better now. 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   Past Surgical History Sander Nephew, CMA; 11/16/2017 9:08 AM) No pertinent past surgical history   Diagnostic Studies History Sander Nephew, CMA; 11/16/2017 9:08 AM) Colonoscopy  never  Allergies Duwayne Heck Gerrigner, CMA; 11/16/2017 9:09 AM) No Known Allergies [11/16/2017]: Allergies Reconciled   Medication History Sander Nephew, CMA; 11/16/2017 9:09 AM) Hydrocortisone (  Tablet, Oral) Active. Synthroid ( Tablet, Oral) Active. Medications Reconciled  Social History Duwayne Heck [employer] fast streamer, CMA; 11/16/2017 9:08 AM) Alcohol use  Occasional alcohol use. Caffeine use  Coffee. No drug use  Tobacco use  Never smoker.  Family History Sander Nephew, CMA; 11/16/2017 9:08 AM) Hypertension  Father, Mother. Seizure disorder  Brother. Thyroid problems  Mother.  Other Problems Sander Nephew, CMA; 11/16/2017 9:08 AM) Gastroesophageal Reflux Disease  Hemorrhoids  Inguinal Hernia  Other disease, cancer, significant illness  Thyroid Disease    Review of Systems (Danielle Gerrigner CMA; 11/16/2017 9:08 AM) General Not Present- Appetite Loss, Chills, Fatigue, Fever, Night Sweats, Weight Gain and Weight Loss. Skin Not Present- Change in Wart/Mole, Dryness, Hives, Jaundice, New Lesions, Non-Healing Wounds, Rash and Ulcer. HEENT Present- Seasonal Allergies, Sinus Pain and  Wears glasses/contact lenses. Not Present- Earache, Hearing Loss, Hoarseness, Nose Bleed, Oral Ulcers, Ringing in the Ears, Sore Throat, Visual Disturbances and Yellow Eyes. Respiratory Not Present- Bloody sputum, Chronic Cough, Difficulty Breathing, Snoring and Wheezing. Breast Not Present- Breast Mass, Breast Pain, Nipple Discharge and Skin Changes. Cardiovascular Not Present- Chest Pain, Difficulty Breathing Lying Down, Leg Cramps, Palpitations, Rapid Heart Rate, Shortness of Breath and Swelling of Extremities. Gastrointestinal Present- Hemorrhoids. Not Present- Abdominal Pain, Bloating, Bloody Stool, Change in Bowel Habits, Chronic diarrhea, Constipation, Difficulty Swallowing, Excessive gas, Gets full quickly at meals, Indigestion, Nausea, Rectal Pain and Vomiting. Musculoskeletal Not Present- Back Pain, Joint Pain, Joint Stiffness, Muscle Pain, Muscle Weakness and Swelling of Extremities. Neurological Not Present- Decreased Memory, Fainting, Headaches, Numbness, Seizures, Tingling, Tremor, Trouble walking and Weakness. Psychiatric Not Present- Anxiety, Bipolar, Change in Sleep Pattern, Depression, Fearful and Frequent crying. Endocrine Not Present- Cold Intolerance, Excessive Hunger, Hair Changes, Heat Intolerance, Hot flashes and New Diabetes. Hematology Not Present- Blood Thinners, Easy Bruising, Excessive bleeding, Gland problems, HIV and Persistent Infections.  Vitals (Danielle Gerrigner CMA; 11/16/2017 9:10 AM) 11/16/2017 9:09 AM Weight: 177 lb Height: 71in Body Surface Area: 2 m Body Mass Index: 24.69 kg/m  Temp.: 98.33F(Oral)  Pulse: 85 (Regular)  BP: 140/90 (Sitting, Right Arm, Standard) Physical Exam Emelia Loron MD; 11/16/2017 9:57 AM) General Mental Status-Alert. Orientation-Oriented X3. Chest and Lung Exam Chest and lung exam reveals -quiet, even and easy respiratory effort with no use of accessory muscles and on auscultation, normal breath sounds, no  adventitious sounds and normal vocal resonance. Cardiovascular Cardiovascular examination reveals -normal heart sounds, regular rate and rhythm with no murmurs. Abdomen Note: soft nt no uh small rih, high riding right testicle, no  lih   Assessment & Plan Emelia Loron MD; 11/16/2017 10:00 AM) INGUINAL HERNIA OF RIGHT SIDE WITHOUT OBSTRUCTION OR GANGRENE (K40.90) RIH repair. We discussed both laparoscopic and open inguinal hernia repairs. I described the procedure in detail. The patient was given educational material. Goals should be achieved with surgery. We discussed the usage of mesh and the rationale behind that. We went over the pathophysiology of inguinal hernia. We have elected to perform open inguinal hernia repair with mesh. We discussed the risks including bleeding, infection, recurrence, postoperative pain and chronic groin pain, testicular injury, urinary retention, numbness in groin and around incision. will also need to discuss mgt of Addisons around surgery. Dr Evlyn Kanner recommended 80 mg solumedrol before surgery and 8 hours after. Will double home dose for 2 days after surgery also

## 2017-12-07 NOTE — Anesthesia Procedure Notes (Signed)
Anesthesia Regional Block: TAP block   Pre-Anesthetic Checklist: ,, timeout performed, Correct Patient, Correct Site, Correct Laterality, Correct Procedure, Correct Position, site marked, Risks and benefits discussed,  Surgical consent,  Pre-op evaluation,  At surgeon's request and post-op pain management  Laterality: Right  Prep: chloraprep       Needles:  Injection technique: Single-shot  Needle Type: Echogenic Needle     Needle Length: 9cm  Needle Gauge: 21     Additional Needles:   Procedures:,,,, ultrasound used (permanent image in chart),,,,  Narrative:  Start time: 12/07/2017 9:20 AM End time: 12/07/2017 9:30 AM Injection made incrementally with aspirations every 5 mL.  Performed by: Personally  Anesthesiologist: Shelton Silvas, MD  Additional Notes: Patient tolerated the procedure well. Local anesthetic introduced in an incremental fashion under minimal resistance after negative aspirations. No paresthesias were elicited. After completion of the procedure, no acute issues were identified and patient continued to be monitored by RN.

## 2017-12-07 NOTE — Discharge Instructions (Signed)
Per instructions from Dr Evlyn Kanner double normal hydrocortisone dose for the next two days then return to your normal dose Take ibuprofen 400 mg by mouth every 8 hours for next three days and then as needed  [facility]  UMBILICAL OR INGUINAL HERNIA REPAIR: POST OP INSTRUCTIONS  Always review your discharge instruction sheet given to you by the facility where your surgery was performed. IF YOU HAVE DISABILITY OR FAMILY LEAVE FORMS, YOU MUST BRING THEM TO THE OFFICE FOR PROCESSING.   DO NOT GIVE THEM TO YOUR DOCTOR.  1. A  prescription for pain medication may be given to you upon discharge.  Take your pain medication as prescribed, if needed.  If narcotic pain medicine is not needed, then you may take acetaminophen (Tylenol), naprosyn (Alleve) or ibuprofen (Advil) as needed. 2. Take your usually prescribed medications unless otherwise directed. 3. If you need a refill on your pain medication, please contact your pharmacy.  They will contact our office to request authorization. Prescriptions will not be filled after 5 pm or on week-ends. 4. You should follow a light diet the first 24 hours after arrival home, such as soup and crackers, etc.  Be sure to include lots of fluids daily.  Resume your normal diet the day after surgery. 5. Most patients will experience some swelling and bruising around the umbilicus or in the groin and scrotum.  Ice packs and reclining will help.  Swelling and bruising can take several days to resolve.  6. It is common to experience some constipation if taking pain medication after surgery.  Increasing fluid intake and taking a stool softener (such as Colace) will usually help or prevent this problem from occurring.  A mild laxative (Milk of Magnesia or Miralax) should be taken according to package directions if there are no bowel movements after 48 hours. 7. Unless discharge instructions indicate otherwise, you may remove your bandages 48 hours after  surgery, and you may shower at that time.  You may have steri-strips (small skin tapes) in place directly over the incision.  These strips should be left on the skin for 7-10 days and will come off on their own.  If your surgeon used skin glue on the incision, you may shower in 24 hours.  The glue will flake off over the next 2-3 weeks.  Any sutures or staples will be removed at the office during your follow-up visit. 8. ACTIVITIES:  You may resume regular (light) daily activities beginning the next day--such as daily self-care, walking, climbing stairs--gradually increasing activities as tolerated.  You may have sexual intercourse when it is comfortable.  Refrain from any heavy lifting or straining until approved by your doctor. a. You may drive when you are no longer taking prescription pain medication, you can comfortably wear a seatbelt, and you can safely maneuver your car and apply brakes. b. RETURN TO WORK:  __________________________________________________________ 9. You should see your doctor in the office for a follow-up appointment approximately 2-3 weeks after your surgery.  Make sure that you call for this appointment within a day or two after you arrive home to insure a convenient appointment time. 10. OTHER INSTRUCTIONS:  __________________________________________________________________________________________________________________________________________________________________________________________  WHEN TO CALL YOUR DOCTOR: 1. Fever over 101.0 2. Inability to urinate 3. Nausea and/or vomiting 4. Extreme swelling or bruising 5. Continued bleeding from incision. 6. Increased pain, redness, or drainage from the incision  The clinic staff is available to answer your questions during regular business hours.  Please dont  hesitate to call and ask to speak to one of the nurses for clinical concerns.  If you have a medical emergency, go to the nearest emergency room or call 911.  A  surgeon from [facility] is always on call at the hospital   [address], [place], [place]  [address] ?  [address] [address], [place], [place]   [address] [phone] ? [phone] ? FAX [phone] Web site: [email or web address]   Post Anesthesia Home Care Instructions  Activity: Get plenty of rest for the remainder of the day. A responsible individual must stay with you for 24 hours following the procedure.  For the next 24 hours, DO NOT: -Drive a car -Advertising copywriter -Drink alcoholic beverages -Take any medication unless instructed by your physician -Make any legal decisions or sign important papers.  Meals: Start with liquid foods such as gelatin or soup. Progress to regular foods as tolerated. Avoid greasy, spicy, heavy foods. If nausea and/or vomiting occur, drink only clear liquids until the nausea and/or vomiting subsides. Call your physician if vomiting continues.  Special Instructions/Symptoms: Your throat may feel dry or sore from the anesthesia or the breathing tube placed in your throat during surgery. If this causes discomfort, gargle with warm salt water. The discomfort should disappear within 24 hours.  If you had a scopolamine patch placed behind your ear for the management of post- operative nausea and/or vomiting:  1. The medication in the patch is effective for 72 hours, after which it should be removed.  Wrap patch in a tissue and discard in the trash. Wash hands thoroughly with soap and water. 2. You may remove the patch earlier than 72 hours if you experience unpleasant side effects which may include dry mouth, dizziness or visual disturbances. 3. Avoid touching the patch. Wash your hands with soap and water after contact with the patch.

## 2017-12-07 NOTE — Anesthesia Postprocedure Evaluation (Signed)
Anesthesia Post Note  Patient: YAMA NIELSON  Procedure(s) Performed: RIGHT HERNIA REPAIR INGUINAL ADULT WITH MESH (Right Groin) INSERTION OF MESH (Right Groin)     Patient location during evaluation: PACU Anesthesia Type: General Level of consciousness: awake and alert Pain management: pain level controlled Vital Signs Assessment: post-procedure vital signs reviewed and stable Respiratory status: spontaneous breathing, nonlabored ventilation, respiratory function stable and patient connected to nasal cannula oxygen Cardiovascular status: blood pressure returned to baseline and stable Postop Assessment: no apparent nausea or vomiting Anesthetic complications: no    Last Vitals:  Vitals:   12/07/17 1300 12/07/17 1309  BP:  135/86  Pulse: 77 82  Resp: 13 18  Temp:  37 C  SpO2: 97% 97%    Last Pain:  Vitals:   12/07/17 1309  TempSrc:   PainSc: 4    Complaint mild deep, achy pain.                Shelton Silvas

## 2017-12-07 NOTE — Progress Notes (Signed)
Assisted Dr. Hollis with right, ultrasound guided, transabdominal plane block. Side rails up, monitors on throughout procedure. See vital signs in flow sheet. Tolerated Procedure well.  

## 2017-12-07 NOTE — Interval H&P Note (Signed)
History and Physical Interval Note:  12/07/2017 10:07 AM  Mark Christian  has presented today for surgery, with the diagnosis of RIGHT INGUINAL HERNIA  The various methods of treatment have been discussed with the patient and family. After consideration of risks, benefits and other options for treatment, the patient has consented to  Procedure(s) with comments: RIGHT HERNIA REPAIR INGUINAL ADULT WITH MESH (Right) - GENERAL WITH TAP BLOCK INSERTION OF MESH (Right) - GENERAL WITH TAP BLOCK as a surgical intervention .  The patient's history has been reviewed, patient examined, no change in status, stable for surgery.  I have reviewed the patient's chart and labs.  Questions were answered to the patient's satisfaction.     Emelia Loron

## 2017-12-07 NOTE — Transfer of Care (Signed)
Immediate Anesthesia Transfer of Care Note  Patient: Mark Christian  Procedure(s) Performed: RIGHT HERNIA REPAIR INGUINAL ADULT WITH MESH (Right Groin) INSERTION OF MESH (Right Groin)  Patient Location: PACU  Anesthesia Type:General  Level of Consciousness: drowsy  Airway & Oxygen Therapy: Patient Spontanous Breathing and Patient connected to face mask oxygen  Post-op Assessment: Report given to RN and Post -op Vital signs reviewed and stable  Post vital signs: Reviewed and stable  Last Vitals:  Vitals Value Taken Time  BP 111/74 12/07/2017 11:33 AM  Temp    Pulse 95 12/07/2017 11:36 AM  Resp 11 12/07/2017 11:36 AM  SpO2 100 % 12/07/2017 11:36 AM  Vitals shown include unvalidated device data.  Last Pain:  Vitals:   12/07/17 0729  TempSrc: Oral  PainSc: 0-No pain      Patients Stated Pain Goal: 0 (12/07/17 0729)  Complications: No apparent anesthesia complications

## 2017-12-08 ENCOUNTER — Encounter (HOSPITAL_BASED_OUTPATIENT_CLINIC_OR_DEPARTMENT_OTHER): Payer: Self-pay | Admitting: General Surgery

## 2018-02-12 DIAGNOSIS — L245 Irritant contact dermatitis due to other chemical products: Secondary | ICD-10-CM | POA: Diagnosis not present

## 2018-02-15 DIAGNOSIS — L304 Erythema intertrigo: Secondary | ICD-10-CM | POA: Diagnosis not present

## 2018-02-15 DIAGNOSIS — L309 Dermatitis, unspecified: Secondary | ICD-10-CM | POA: Diagnosis not present

## 2018-03-15 DIAGNOSIS — E310 Autoimmune polyglandular failure: Secondary | ICD-10-CM | POA: Diagnosis not present

## 2018-03-15 DIAGNOSIS — E038 Other specified hypothyroidism: Secondary | ICD-10-CM | POA: Diagnosis not present

## 2018-03-15 DIAGNOSIS — E271 Primary adrenocortical insufficiency: Secondary | ICD-10-CM | POA: Diagnosis not present

## 2018-05-01 DIAGNOSIS — Z23 Encounter for immunization: Secondary | ICD-10-CM | POA: Diagnosis not present

## 2018-05-09 IMAGING — CT CT ABD-PELV W/ CM
2 of 5 series · 14 of 46 positions shown, 16 images · IV contrast (isovue)
Comparison: CT scan dated 10/15/2010

CLINICAL DATA: Right inguinal hernia with increasing pain.

EXAM:
CT ABDOMEN AND PELVIS WITH CONTRAST
TECHNIQUE: Multidetector CT imaging of the abdomen and pelvis was performed
using the standard protocol following bolus administration of
intravenous contrast.
CONTRAST:  100 cc Isovue 300

[Series 2: abd pelvis 5.00 br40 s3 ax · axial · 0.60mm/px · z∈[+1233,+1633]mm · 11 of 90 slices shown, 13 images]
[im 5/90  soft-tissue]
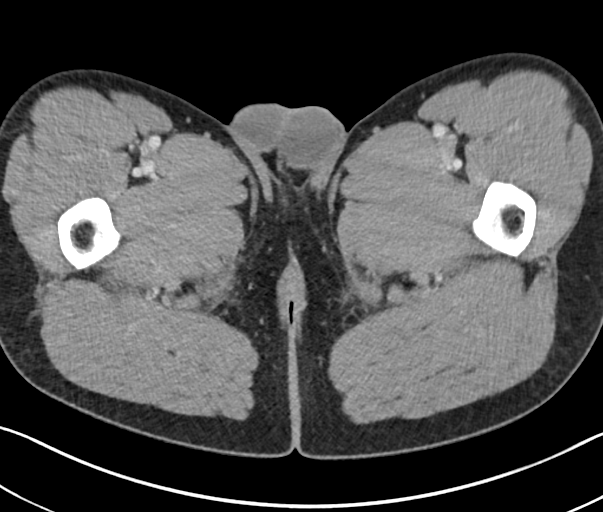
[im 5/90  bone]
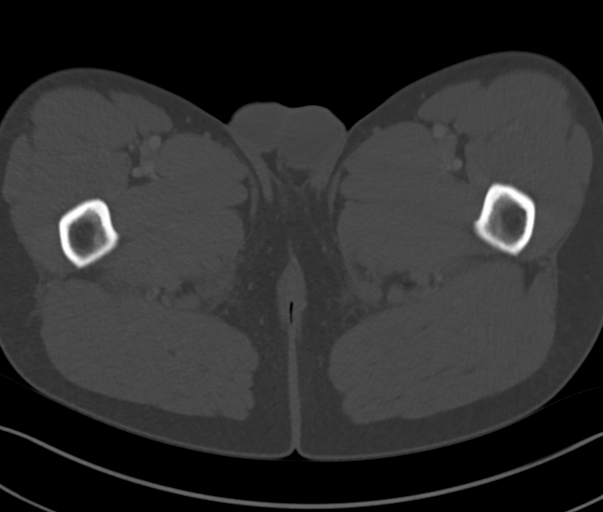
[im 15/90  soft-tissue]
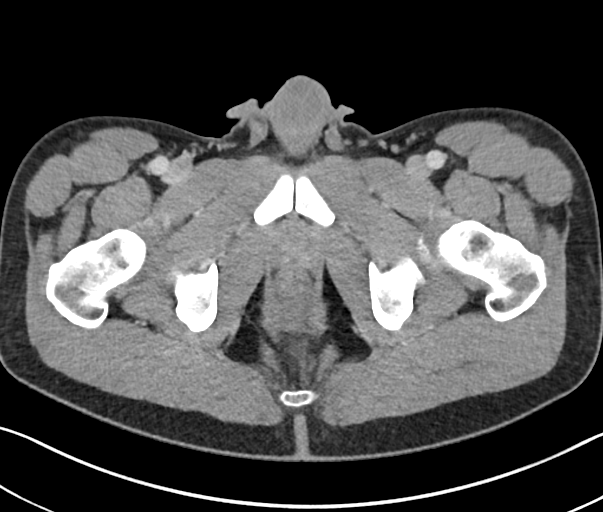
[im 24/90  soft-tissue]
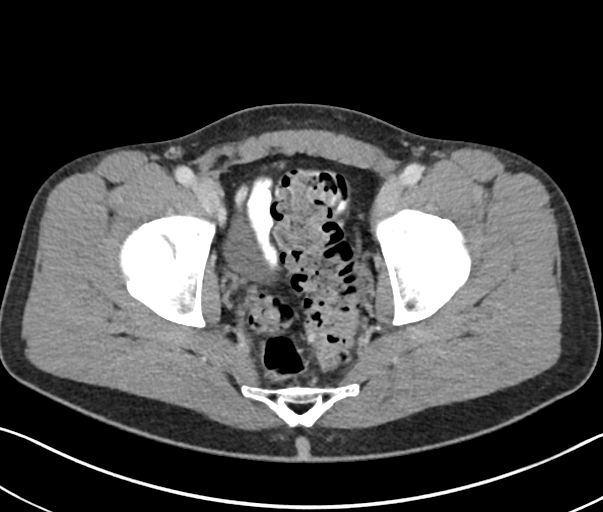
[im 29/90  soft-tissue]
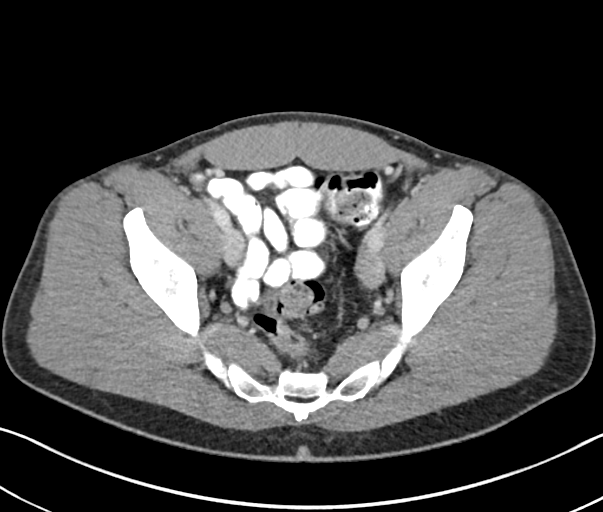
[im 38/90  soft-tissue]
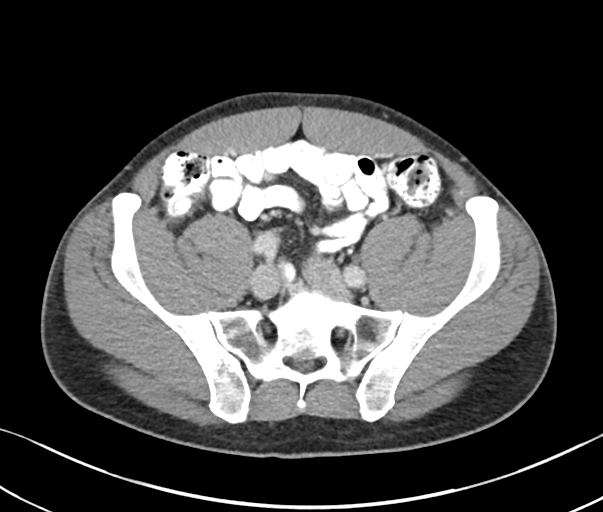
[im 47/90  soft-tissue]
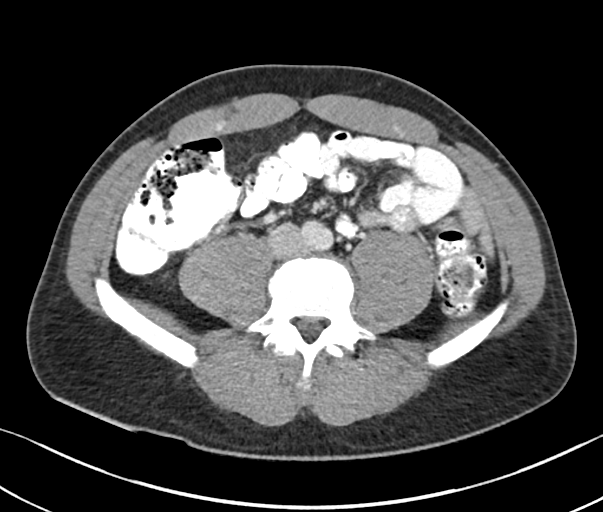
[im 52/90  soft-tissue]
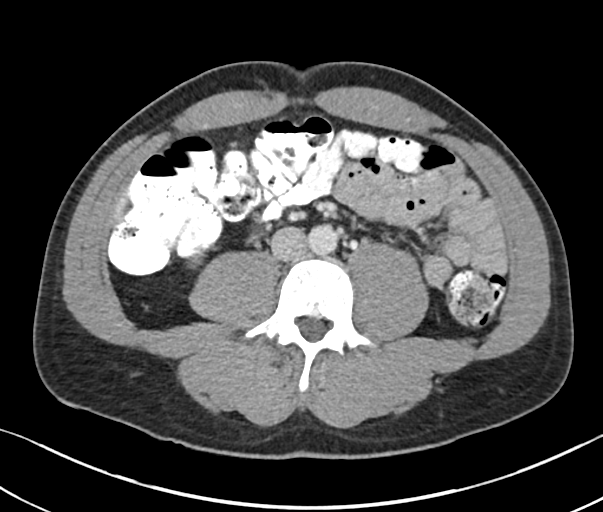
[im 61/90  soft-tissue]
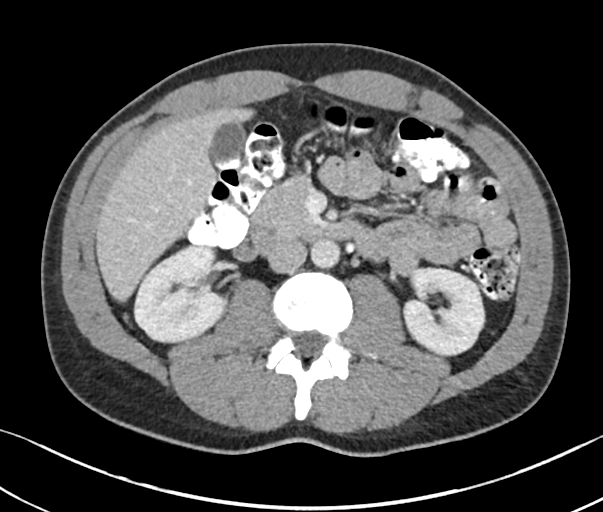
[im 66/90  soft-tissue]
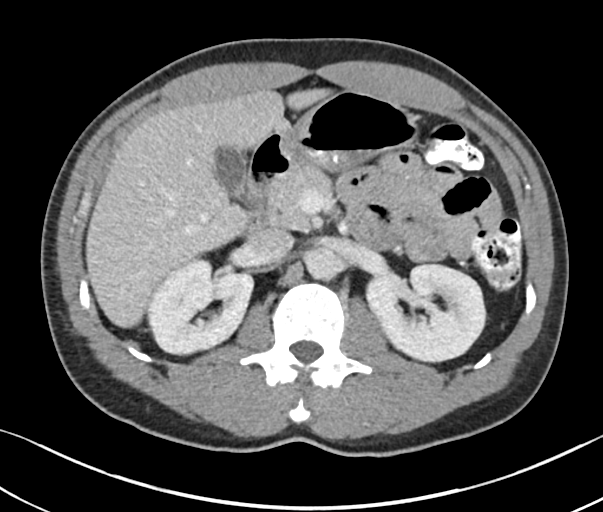
[im 66/90  bone]
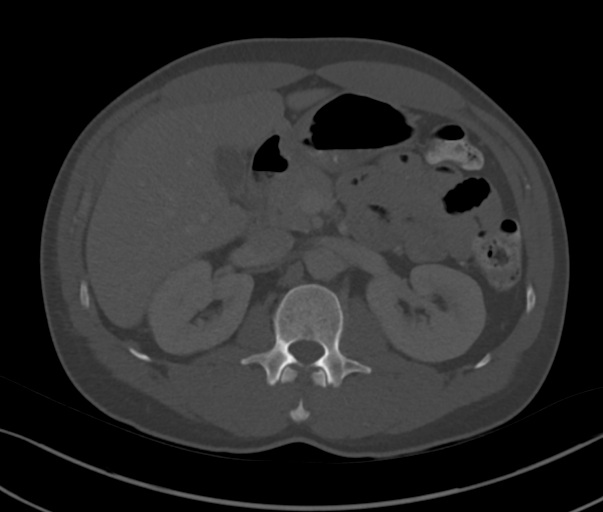
[im 75/90  soft-tissue]
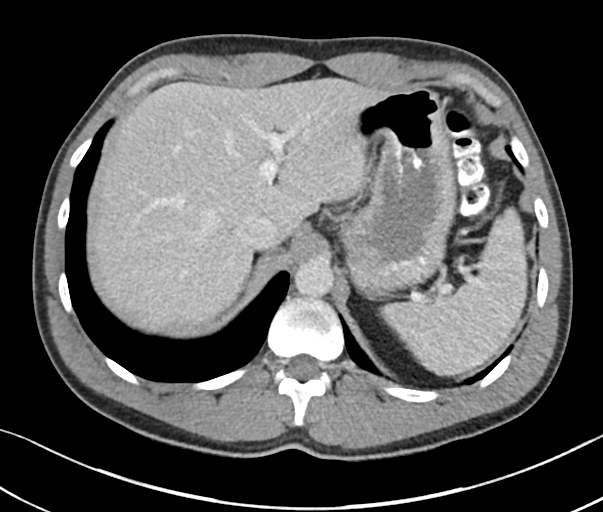
[im 85/90  soft-tissue]
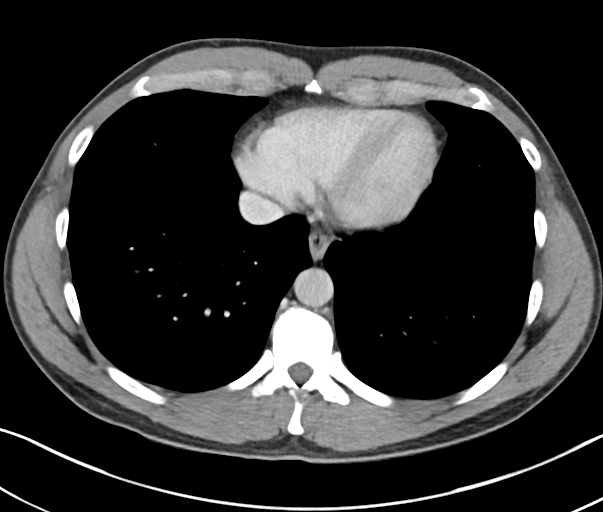

[Series 6: abd pelvis 2.00 br40 s3 cor · coronal · 0.71mm/px · 3 of 148 slices shown]
[im 50/148  soft-tissue]
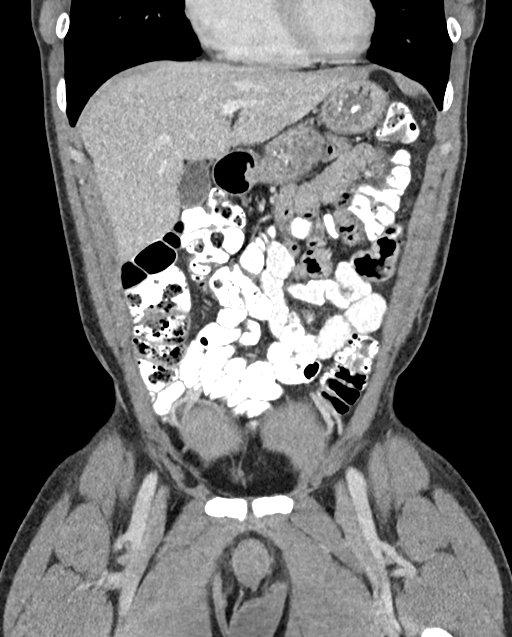
[im 66/148  soft-tissue]
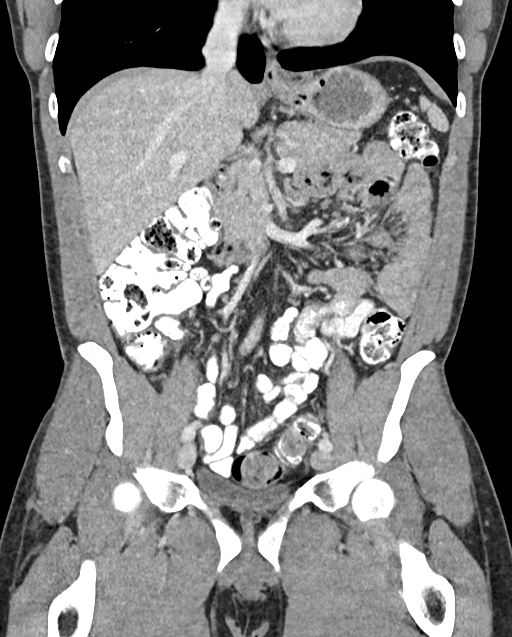
[im 82/148  soft-tissue]
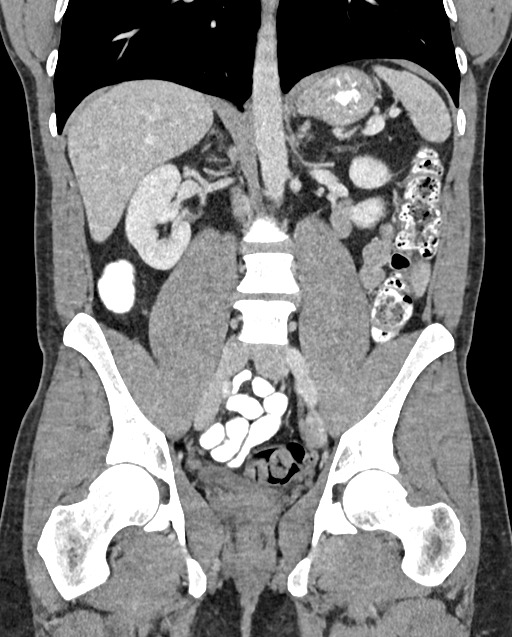

[14 of 46 positions shown; findings below may reference images not displayed]

FINDINGS: Lower chest: Normal.

Hepatobiliary: 4 mm cyst in the anterior aspect of the dome of the
right lobe of the liver. Liver parenchyma is otherwise normal.
Biliary tree is normal.

Pancreas: Unremarkable. No pancreatic ductal dilatation or
surrounding inflammatory changes.

Spleen: Normal in size without focal abnormality.

Adrenals/Urinary Tract: Adrenal glands are unremarkable. Kidneys are
normal, without renal calculi, focal lesion, or hydronephrosis.
Bladder is unremarkable.

Stomach/Bowel: Stomach is within normal limits. Appendix appears
normal. No evidence of bowel wall thickening, distention, or
inflammatory changes.

Vascular/Lymphatic: No significant vascular findings are present. No
enlarged abdominal or pelvic lymph nodes.

Reproductive: Prostate is unremarkable.

Other: There is a small right inguinal hernia containing only fat.
The defect is best seen on image 60 of series 8 and only measures 3
x 4 mm in size. The herniated fat measures approximately 4 x 2 x 1
cm.

Musculoskeletal: No acute or significant osseous findings.
IMPRESSION: Tiny right inguinal hernia containing a small amount of peritoneal
fat. Otherwise, normal exam.

## 2018-06-27 DIAGNOSIS — M7021 Olecranon bursitis, right elbow: Secondary | ICD-10-CM | POA: Diagnosis not present

## 2018-06-27 DIAGNOSIS — E271 Primary adrenocortical insufficiency: Secondary | ICD-10-CM | POA: Diagnosis not present

## 2018-06-29 DIAGNOSIS — M7021 Olecranon bursitis, right elbow: Secondary | ICD-10-CM | POA: Diagnosis not present

## 2018-07-12 DIAGNOSIS — M7021 Olecranon bursitis, right elbow: Secondary | ICD-10-CM | POA: Diagnosis not present

## 2018-07-30 DIAGNOSIS — R05 Cough: Secondary | ICD-10-CM | POA: Diagnosis not present

## 2018-07-30 DIAGNOSIS — J0140 Acute pansinusitis, unspecified: Secondary | ICD-10-CM | POA: Diagnosis not present

## 2018-09-18 DIAGNOSIS — M7021 Olecranon bursitis, right elbow: Secondary | ICD-10-CM | POA: Diagnosis not present

## 2018-09-18 DIAGNOSIS — E310 Autoimmune polyglandular failure: Secondary | ICD-10-CM | POA: Diagnosis not present

## 2018-09-18 DIAGNOSIS — E271 Primary adrenocortical insufficiency: Secondary | ICD-10-CM | POA: Diagnosis not present

## 2018-09-18 DIAGNOSIS — Z23 Encounter for immunization: Secondary | ICD-10-CM | POA: Diagnosis not present

## 2019-09-11 ENCOUNTER — Ambulatory Visit: Payer: 59 | Admitting: Neurology

## 2019-09-11 ENCOUNTER — Encounter: Payer: Self-pay | Admitting: Neurology

## 2019-09-11 ENCOUNTER — Other Ambulatory Visit: Payer: Self-pay

## 2019-09-11 VITALS — BP 132/84 | HR 90 | Temp 97.3°F | Ht 71.0 in | Wt 184.0 lb

## 2019-09-11 DIAGNOSIS — R253 Fasciculation: Secondary | ICD-10-CM | POA: Diagnosis not present

## 2019-09-11 DIAGNOSIS — R29898 Other symptoms and signs involving the musculoskeletal system: Secondary | ICD-10-CM | POA: Diagnosis not present

## 2019-09-11 DIAGNOSIS — R531 Weakness: Secondary | ICD-10-CM | POA: Insufficient documentation

## 2019-09-11 DIAGNOSIS — R251 Tremor, unspecified: Secondary | ICD-10-CM | POA: Insufficient documentation

## 2019-09-11 DIAGNOSIS — R002 Palpitations: Secondary | ICD-10-CM

## 2019-09-11 DIAGNOSIS — R072 Precordial pain: Secondary | ICD-10-CM | POA: Diagnosis not present

## 2019-09-11 DIAGNOSIS — E272 Addisonian crisis: Secondary | ICD-10-CM

## 2019-09-11 MED ORDER — ALPRAZOLAM 0.25 MG PO TABS: 0.2500 mg | ORAL_TABLET | Freq: Every evening | ORAL | 0 refills | Status: DC | PRN

## 2019-09-11 NOTE — Progress Notes (Signed)
Provider:  Melvyn Novas, MD  Primary Care Physician:  Adrian Prince, MD [address] [place] [address]     Referring Provider: Adrian Prince, Md [address] [place],  [place] [address]          Chief Complaint according to patient   Patient presents with:    . New Patient (Initial Visit)           HISTORY OF PRESENT ILLNESS:  Mark Christian is a 49 y.o. year old Caucasian male patient seen here on 09/11/2019 upon referral from dr Evlyn Kanner for an evaluation of increased fatigue, muscle  twitching and fasciculations, exhaustion.   Chief concern according to patient :  Mr and Mrs. 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  By July 15, 2019 he had for the first time noticed fatigue to a degree that he was not used to.  He felt that he always had a lot of energy and suddenly there was none.  By 15 December a Covid test was obtained through the city of [place] and returned negative this was only the first of several Covid tests.  All returning negative, he felt flulike, achy and it was suspected that he may have a possible sinusitis.  On December 28 and January 6 he did have another Covid test each time negative on 29 January he underwent antibody testing which also returned negative the symptoms were described as tingling prickly sensations in arms and hands legs and feet below the knee and often are more pronounced when he was seated.  He did not have as much of the sensations on the torso.  His lips began to tingle and he felt that his ability to concentrate and focus was very impaired.  This brain fog has worsened over time.  He feels continuously extremely fatigued not necessarily progressive but also not improving.  He developed a headache and usually does not have 1 his hands and feet became cold and sometimes to the level where he needed a heating pad to become comfortable.  Overall he  tended to become shaky and weak throughout his body and has no longer any exercise tolerance comparable to his normal state.  He also reports muscle weakness in all extremities muscle twitching and pulsating in the right shoulder has been hurting but he did have an injury there in October of last year there is further unexplained left hip pain but also neck and shoulder stiffness he has been using Ambien to get some restful sleep, he is going to the bathroom more often, in the flank and groin he had discovered some swollen lymph nodes, he felt that his vision was at times blurry even with glasses, his handwriting was less accurate and more shaky and minimal activity now will cause him to need rest to recover his level of energy.   He is easily feeling shaky and trembling.     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  he  Carries this diagnosis in 2004, after a crisis in 4 -2004.   has a past medical history of Addison's disease (HCC), GERD (gastroesophageal reflux disease), Hypothyroidism, Right inguinal hernia, and Seasonal allergies., anxiety.        Social history:  Patient is working as a Nurse, adult, days, last year he had a  lot of extra shifts.  He  lives in a household with 4 persons. Family status is married  with 3 children.  Pets are present. 2 dogs.  Tobacco use; never .  ETOH use ; rare  Caffeine intake in form of Coffee( 2-3 ) Soda( recently had more of them) Tea ( none) -noenergy drinks. Regular exercise - intense physical activity.   Hobbies : yoga      Review of Systems: Out of a complete 14 system review, the patient complains of only the following symptoms, and all other reviewed systems are negative.:  Fatigue.  Social History   Socioeconomic History  . Marital status: Married    Spouse name: Not on file  . Number of children: Not on file  . Years of education: Not on file  . Highest education  level: Not on file  Occupational History  . Not on file  Tobacco Use  . Smoking status: Never Smoker  . Smokeless tobacco: Never Used  Substance and Sexual Activity  . Alcohol use: Yes    Comment: occassional   . Drug use: No  . Sexual activity: Yes  Other Topics Concern  . Not on file  Social History Narrative   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 Pt gets regular exercise.    Social Determinants of Health   Financial Resource Strain:   . Difficulty of Paying Living Expenses: Not on file  Food Insecurity:   . Worried About Programme researcher, broadcasting/film/video in the Last Year: Not on file  . Ran Out of Food in the Last Year: Not on file  Transportation Needs:   . Lack of Transportation (Medical): Not on file  . Lack of Transportation (Non-Medical): Not on file  Physical Activity:   . Days of Exercise per Week: Not on file  . Minutes of Exercise per Session: Not on file  Stress:   . Feeling of Stress : Not on file  Social Connections:   . Frequency of Communication with Friends and Family: Not on file  . Frequency of Social Gatherings with Friends and Family: Not on file  . Attends Religious Services: Not on file  . Active Member of Clubs or Organizations: Not on file  . Attends Banker Meetings: Not on file  . Marital Status: Not on file    Family History  Problem Relation Age of Onset  . Parkinson's disease Maternal Grandfather   . Dementia Paternal Grandmother   . Colon cancer Neg Hx     Past Medical History:  Diagnosis Date  . Addison's disease (HCC)    dx'd in 2004. h/o adrenal crisis in 2004  . GERD (gastroesophageal reflux disease)    OTC as needed  . Hypothyroidism    Addison's Disease  . Right inguinal hernia   . Seasonal allergies     Past Surgical History:  Procedure Laterality Date  . INGUINAL HERNIA REPAIR Right 12/07/2017   Procedure: RIGHT HERNIA REPAIR INGUINAL ADULT WITH MESH;  Surgeon: Emelia Loron, MD;  Location: [facility];  Service: General;  Laterality: Right;  GENERAL WITH TAP BLOCK  . INSERTION OF MESH Right 12/07/2017   Procedure: INSERTION OF MESH;  Surgeon: Emelia Loron, MD;  Location: [facility];  Service: General;  Laterality: Right;  GENERAL WITH TAP BLOCK  . RHINOPLASTY  1970s      Current Outpatient Medications on File Prior to Visit  Medication Sig Dispense Refill  . cetirizine (ZYRTEC) 10 MG  tablet Take 10 mg by mouth as needed.      Marland Kitchen FLUDROCORTISONE ACETATE PO Take 0.1 mg by mouth daily. 0.1 mg tabs    . hydrocortisone (CORTEF) 10 MG tablet Take by mouth 2 (two) times daily. Take 20 mg every am and 10 mg every evening     . levothyroxine (SYNTHROID, LEVOTHROID) 150 MCG tablet Take 150 mcg by mouth daily.      . Saccharomyces boulardii (PROBIOTIC) 250 MG CAPS Take by mouth.     Current Facility-Administered Medications on File Prior to Visit  Medication Dose Route Frequency Provider Last Rate Last Admin  . methylPREDNISolone sodium succinate (SOLU-MEDROL) 80 mg in sodium chloride 0.9 % 50 mL IVPB  80 mg Intravenous Once Rolm Bookbinder, MD        Allergies  Allergen Reactions  . Amoxicillin-Pot Clavulanate     Stomach upset    Physical exam:  Today's Vitals   09/11/19 1455  BP: 132/84  Pulse: 90  Temp: (!) 97.3 F (36.3 C)  Weight: 184 lb (83.5 kg)  Height: 5\' 11"  (1.803 m)   Body mass index is 25.66 kg/m.   Wt Readings from Last 3 Encounters:  09/11/19 184 lb (83.5 kg)  12/07/17 177 lb 9.6 oz (80.6 kg)     Ht Readings from Last 3 Encounters:  09/11/19 5\' 11"  (1.803 m)  12/07/17 5\' 10"  (1.778 m)      General: The patient is awake, alert and appears not in acute distress. The patient is well groomed. Head: Normocephalic, atraumatic. Neck is supple. Mallampati 2,  neck circumference:17 inches . Nasal airflow  patent.  Retrognathia is not  seen.  Dental status: n/a  Cardiovascular:  Regular rate and cardiac rhythm by pulse,  without distended  neck veins. Respiratory: Lungs are clear to auscultation.  Skin:  Without evidence of ankle edema, or rash. Trunk: The patient's posture is erect.   Neurologic exam : The patient is awake and alert, oriented to place and time.   Memory subjective described as intact.  Attention span & concentration ability appears normal.  Speech is fluent,  without dysarthria, dysphonia or aphasia.  Mood and affect are appropriate.   Cranial nerves: no loss of smell or taste reported  Pupils are equal and briskly reactive to light. Funduscopic exam deferred.   Extraocular movements in vertical and horizontal planes were intact and without nystagmus. No Diplopia. Visual fields by finger perimetry are intact. Hearing was intact to soft voice and finger rubbing.    Facial sensation intact to fine touch.  Facial motor strength is symmetric and tongue and uvula move midline. Tongue is trembling.  Neck ROM : rotation, tilt and flexion extension were normal for age and shoulder shrug was symmetrical.    Motor exam:  Symmetric bulk, tone and ROM.   Normal tone without cog wheeling, symmetric grip strength .   Sensory:  Fine touch, pinprick and vibration were normal.  Proprioception tested in the upper extremities was normal.   Coordination: Rapid alternating movements in the fingers/hands were of normal speed.  The Finger-to-nose maneuver was intact without evidence of ataxia, dysmetria and with a very low amplitude tremor.    Gait and station: Patient could rise unassisted from a seated position, walked without assistive device.  Stance is of normal width/ base and the patient turned with 2 steps.  Toe and heel walk were deferred.  Deep tendon reflexes: in the  upper and lower extremities are symmetric and intact.  Babinski  response was deferred.    CT head in [place] was normal- non contrast. He has slept only 4 hours a night for almost 2 month, he is exhausted. Worried, anxious. Melatonin and Ambien  no longer working since the pandemic onset.    After spending a total time of 45 minutes face to face and additional time for physical and neurologic examination, review of laboratory studies,  personal review of imaging studies, reports and results of other testing and review of referral information / records as far as provided in visit, I have established the following assessments:  1) I do not see a focal neurologic deficit but I have witnessed fasciculations on the left 12 on both forearms and to up the back of the hand.  The patient has a trembling tongue but the tongue does not fasciculate.  His speech has not changed and he seems not to have trouble handling saliva.  There is no eye blink artifact, and his gait had a normal base good stability and flexibility.  He could turn this to steps 180 degrees no drift, his deep tendon reflexes are brisk and symmetric   My Plan is to proceed with:  1) EMG and NCV , rule out motor neuron disorder.  2) caffeine over use?  Anxiety?  3) treat insomnia with Xanax for the next 30 days.   I would like to thank Adrian Prince, MD and Adrian Prince, Md [address] [place],  [place] [address] for allowing me to meet with and to take care of this pleasant patient.   In short, Mark Christian is presenting with that may all relate to anxiety.   I plan to follow up either personally or through our NP within 1 month.   CC: I will share my notes with PCP.   Electronically signed by: Melvyn Novas, MD 09/11/2019 3:12 PM  [facility] Board certified by The [employer] of Sleep Medicine and Diplomate of the [employer] of Sleep Medicine. Board certified In Neurology through the ABPN, Fellow of the [employer] of Neurology. Medical Director of [facility].

## 2019-09-11 NOTE — Patient Instructions (Signed)

## 2019-09-11 NOTE — Addendum Note (Signed)
Addended by: Melvyn Novas on: 09/11/2019 03:54 PM   Modules accepted: Orders

## 2019-09-12 ENCOUNTER — Encounter: Payer: Self-pay | Admitting: Neurology

## 2019-09-23 ENCOUNTER — Telehealth: Payer: Self-pay | Admitting: Neurology

## 2019-09-23 ENCOUNTER — Encounter: Payer: Self-pay | Admitting: Neurology

## 2019-09-23 ENCOUNTER — Other Ambulatory Visit: Payer: Self-pay | Admitting: Neurology

## 2019-09-23 DIAGNOSIS — E272 Addisonian crisis: Secondary | ICD-10-CM

## 2019-09-23 DIAGNOSIS — R072 Precordial pain: Secondary | ICD-10-CM

## 2019-09-23 DIAGNOSIS — R531 Weakness: Secondary | ICD-10-CM

## 2019-09-23 DIAGNOSIS — R29898 Other symptoms and signs involving the musculoskeletal system: Secondary | ICD-10-CM

## 2019-09-23 DIAGNOSIS — R251 Tremor, unspecified: Secondary | ICD-10-CM

## 2019-09-23 DIAGNOSIS — R002 Palpitations: Secondary | ICD-10-CM

## 2019-09-23 DIAGNOSIS — R253 Fasciculation: Secondary | ICD-10-CM

## 2019-09-23 NOTE — Telephone Encounter (Signed)
[employer] Auth: NPR via [employer] website order sent to [facility]. They will reach out to the patient to schedule.

## 2019-09-25 ENCOUNTER — Other Ambulatory Visit: Payer: Self-pay | Admitting: Neurology

## 2019-09-30 ENCOUNTER — Ambulatory Visit (INDEPENDENT_AMBULATORY_CARE_PROVIDER_SITE_OTHER): Payer: 59 | Admitting: Neurology

## 2019-09-30 ENCOUNTER — Encounter: Payer: 59 | Admitting: Neurology

## 2019-09-30 ENCOUNTER — Other Ambulatory Visit: Payer: Self-pay

## 2019-09-30 DIAGNOSIS — R253 Fasciculation: Secondary | ICD-10-CM

## 2019-09-30 DIAGNOSIS — Z0289 Encounter for other administrative examinations: Secondary | ICD-10-CM

## 2019-09-30 DIAGNOSIS — R251 Tremor, unspecified: Secondary | ICD-10-CM

## 2019-09-30 DIAGNOSIS — R531 Weakness: Secondary | ICD-10-CM

## 2019-09-30 DIAGNOSIS — R002 Palpitations: Secondary | ICD-10-CM

## 2019-09-30 DIAGNOSIS — R072 Precordial pain: Secondary | ICD-10-CM

## 2019-09-30 NOTE — Procedures (Signed)
Full Name: Mark Christian Gender: Male MRN #: [id number] Date of Birth: 1971-03-10    Visit Date: 09/30/2019 07:59 Age: 49 Years Examining Physician: Marcial Pacas, MD  Referring Physician: Larey Seat, MD History: 49 year old male, presented with transient upper and lower extremity paresthesia.  Summary of the test  Nerve conduction study  Right sural, superficial peroneal, medial and ulnar sensory responses were normal  Right peroneal to EDB, tibial, median and ulnar motor responses were normal  Electromyography: Selected needle examination of the right upper, lower extremity muscles; right cervical and lumbosacral paraspinal muscles were normal  Conclusion: This is a normal study, there is no electrodiagnostic evidence of large fiber peripheral neuropathy, right cervical, or lumbosacral radiculopathy.    ------------------------------- Marcial Pacas, M.D. PhD  [facility] [place], [place] [address] Tel: [phone] Fax: [phone]         [facility]    Nerve / Sites Muscle Latency Ref. Amplitude Ref. Rel Amp Segments Distance Velocity Ref. Area    ms ms mV mV %  cm m/s m/s mVms  R Median - APB     Wrist APB 3.4 ?4.4 11.0 ?4.0 100 Wrist - APB 7   41.8     Upper arm APB 7.6  9.8  89.7 Upper arm - Wrist 24 57 ?49 37.6  R Ulnar - ADM     Wrist ADM 3.0 ?3.3 11.9 ?6.0 100 Wrist - ADM 7   44.0     B.Elbow ADM 6.6  8.8  74 B.Elbow - Wrist 22 62 ?49 31.2     A.Elbow ADM 8.2  8.6  97.1 A.Elbow - B.Elbow 10 60 ?49 30.9         A.Elbow - Wrist      R Peroneal - EDB     Ankle EDB 4.0 ?6.5 11.3 ?2.0 100 Ankle - EDB 9   42.4     Fib head EDB 10.5  11.2  98.7 Fib head - Ankle 33 50 ?44 40.5     Pop fossa EDB 12.7  11.1  99.2 Pop fossa - Fib head 10 46 ?44 41.0         Pop fossa - Ankle      R Tibial - AH     Ankle AH 3.5 ?5.8 16.5 ?4.0 100 Ankle - AH 9   45.7     Pop fossa AH 13.2  13.0  78.7 Pop fossa - Ankle 43 44 ?41 37.8              SNC    Nerve / Sites Rec. Site Peak Lat Ref.  Amp Ref. Segments Distance    ms ms V V  cm  R Sural - Ankle (Calf)     Calf Ankle 3.5 ?4.4 27 ?6 Calf - Ankle 14  R Superficial peroneal - Ankle     Lat leg Ankle 3.7 ?4.4 16 ?6 Lat leg - Ankle 14  R Median - Orthodromic (Dig II, Mid palm)     Dig II Wrist 3.2 ?3.4 15 ?10 Dig II - Wrist 13  R Ulnar - Orthodromic, (Dig V, Mid palm)     Dig V Wrist 2.9 ?3.1 11 ?5 Dig V - Wrist 52             F  Wave    Nerve F Lat Ref.   ms ms  R Tibial - AH 52.3 ?56.0  R Ulnar - ADM 30.3 ?32.0  EMG Summary Table    Spontaneous MUAP Recruitment  Muscle IA Fib PSW Fasc Other Amp Dur. Poly Pattern  R. Tibialis anterior Normal None None None _______ Normal Normal Normal Normal  R. Tibialis posterior Normal None None None _______ Normal Normal Normal Normal  R. Peroneus longus Normal None None None _______ Normal Normal Normal Normal  R. Gastrocnemius (Medial head) Normal None None None _______ Normal Normal Normal Normal  R. Vastus lateralis Normal None None None _______ Normal Normal Normal Normal  R. Lumbar paraspinals (mid) Normal None None None _______ Normal Normal Normal Normal  R. Lumbar paraspinals (low) Normal None None None _______ Normal Normal Normal Normal  R. Abductor pollicis brevis Normal None None None _______ Normal Normal Normal Normal  R. Pronator teres Normal None None None _______ Normal Normal Normal Normal  R. Biceps brachii Normal None None None _______ Normal Normal Normal Normal  R. Deltoid Normal None None None _______ Normal Normal Normal Normal  R. Extensor digitorum communis Normal None None None _______ Normal Normal Normal Normal  R. Cervical paraspinals Normal None None None _______ Normal Normal Normal Normal

## 2019-10-01 ENCOUNTER — Encounter: Payer: Self-pay | Admitting: Neurology

## 2019-10-01 NOTE — Progress Notes (Signed)
Selected needle examination of the right upper, lower extremity  muscles; right cervical and lumbosacral paraspinal muscles were  normal   Conclusion:  This is a normal study, there is no electrodiagnostic evidence of  large fiber peripheral neuropathy, right cervical, or lumbosacral  radiculopathy.

## 2019-10-07 ENCOUNTER — Encounter: Payer: 59 | Admitting: Neurology

## 2019-10-14 ENCOUNTER — Other Ambulatory Visit: Payer: Self-pay

## 2019-10-14 ENCOUNTER — Ambulatory Visit
Admission: RE | Admit: 2019-10-14 | Discharge: 2019-10-14 | Disposition: A | Discharge status: Home / Self Care | Payer: 59 | Source: Ambulatory Visit | Attending: Neurology | Admitting: Neurology

## 2019-10-14 DIAGNOSIS — E272 Addisonian crisis: Secondary | ICD-10-CM

## 2019-10-14 DIAGNOSIS — R253 Fasciculation: Secondary | ICD-10-CM

## 2019-10-14 DIAGNOSIS — R251 Tremor, unspecified: Secondary | ICD-10-CM | POA: Diagnosis not present

## 2019-10-14 DIAGNOSIS — R072 Precordial pain: Secondary | ICD-10-CM

## 2019-10-14 DIAGNOSIS — R002 Palpitations: Secondary | ICD-10-CM

## 2019-10-14 DIAGNOSIS — R531 Weakness: Secondary | ICD-10-CM

## 2019-10-14 DIAGNOSIS — R29898 Other symptoms and signs involving the musculoskeletal system: Secondary | ICD-10-CM

## 2019-10-14 MED ORDER — GADOBENATE DIMEGLUMINE 529 MG/ML IV SOLN
17.0000 mL | Freq: Once | INTRAVENOUS | Status: AC | PRN
  Administered 2019-10-14: 17 mL via INTRAVENOUS

## 2019-10-14 NOTE — Progress Notes (Signed)
No foreign metallic body was found . CD

## 2019-10-14 NOTE — Progress Notes (Signed)
IMPRESSION:  This MRI of the cervical spine with and without contrast shows the following:  1.  The spinal cord appears normal. 2.   Mild degenerative changes as detailed above that do not lead to spinal stenosis or nerve root compression. 3.   There is a normal enhancement pattern.  This is a normal cervical spine image and shows no significant DDD or spinal cord abnormality- Glad to report NORMAL  study.   Please leave a message -follow up is optional.

## 2019-10-15 ENCOUNTER — Ambulatory Visit: Payer: 59 | Admitting: Neurology

## 2019-10-15 ENCOUNTER — Encounter: Payer: Self-pay | Admitting: Neurology

## 2019-10-15 VITALS — BP 138/87 | HR 76 | Temp 97.2°F | Ht 71.0 in | Wt 188.0 lb

## 2019-10-15 DIAGNOSIS — E272 Addisonian crisis: Secondary | ICD-10-CM

## 2019-10-15 DIAGNOSIS — R002 Palpitations: Secondary | ICD-10-CM | POA: Diagnosis not present

## 2019-10-15 DIAGNOSIS — R0989 Other specified symptoms and signs involving the circulatory and respiratory systems: Secondary | ICD-10-CM | POA: Diagnosis not present

## 2019-10-15 MED ORDER — ALPRAZOLAM 0.5 MG PO TABS: 0.5000 mg | ORAL_TABLET | Freq: Every evening | ORAL | 0 refills | Status: DC | PRN

## 2019-10-15 MED ORDER — CITALOPRAM HYDROBROMIDE 10 MG PO TABS: 10.0000 mg | ORAL_TABLET | Freq: Every day | ORAL | 5 refills | Status: DC

## 2019-10-15 NOTE — Patient Instructions (Signed)
Citalopram tablets What is this medicine? CITALOPRAM (sye TAL oh pram) is a medicine for depression. This medicine may be used for other purposes; ask your health care provider or pharmacist if you have questions. COMMON BRAND NAME(S): Celexa What should I tell my health care provider before I take this medicine? They need to know if you have any of these conditions:  bleeding disorders  bipolar disorder or a family history of bipolar disorder  glaucoma  heart disease  history of irregular heartbeat  kidney disease  liver disease  low levels of magnesium or potassium in the blood  receiving electroconvulsive therapy  seizures  suicidal thoughts, plans, or attempt; a previous suicide attempt by you or a family member  take medicines that treat or prevent blood clots  thyroid disease  an unusual or allergic reaction to citalopram, escitalopram, other medicines, foods, dyes, or preservatives  pregnant or trying to become pregnant  breast-feeding How should I use this medicine? Take this medicine by mouth with a glass of water. Follow the directions on the prescription label. You can take it with or without food. Take your medicine at regular intervals. Do not take your medicine more often than directed. Do not stop taking this medicine suddenly except upon the advice of your doctor. Stopping this medicine too quickly may cause serious side effects or your condition may worsen. A special MedGuide will be given to you by the pharmacist with each prescription and refill. Be sure to read this information carefully each time. Talk to your pediatrician regarding the use of this medicine in children. Special care may be needed. Patients over 60 years old may have a stronger reaction and need a smaller dose. Overdosage: If you think you have taken too much of this medicine contact a poison control center or emergency room at once. NOTE: This medicine is only for you. Do not share  this medicine with others. What if I miss a dose? If you miss a dose, take it as soon as you can. If it is almost time for your next dose, take only that dose. Do not take double or extra doses. What may interact with this medicine? Do not take this medicine with any of the following medications:  certain medicines for fungal infections like fluconazole, itraconazole, ketoconazole, posaconazole, voriconazole  cisapride  dronedarone  escitalopram  linezolid  MAOIs like Carbex, Eldepryl, Marplan, Nardil, and Parnate  methylene blue (injected into a vein)  pimozide  thioridazine This medicine may also interact with the following medications:  alcohol  amphetamines  aspirin and aspirin-like medicines  carbamazepine  certain medicines for depression, anxiety, or psychotic disturbances  certain medicines for infections like chloroquine, clarithromycin, erythromycin, furazolidone, isoniazid, pentamidine  certain medicines for migraine headaches like almotriptan, eletriptan, frovatriptan, naratriptan, rizatriptan, sumatriptan, zolmitriptan  certain medicines for sleep  certain medicines that treat or prevent blood clots like dalteparin, enoxaparin, warfarin  cimetidine  diuretics  dofetilide  fentanyl  lithium  methadone  metoprolol  NSAIDs, medicines for pain and inflammation, like ibuprofen or naproxen  omeprazole  other medicines that prolong the QT interval (cause an abnormal heart rhythm)  procarbazine  rasagiline  supplements like St. John's wort, kava kava, valerian  tramadol  tryptophan  ziprasidone This list may not describe all possible interactions. Give your health care provider a list of all the medicines, herbs, non-prescription drugs, or dietary supplements you use. Also tell them if you smoke, drink alcohol, or use illegal drugs. Some items may interact with   your medicine. What should I watch for while using this medicine? Tell your  doctor if your symptoms do not get better or if they get worse. Visit your doctor or health care professional for regular checks on your progress. Because it may take several weeks to see the full effects of this medicine, it is important to continue your treatment as prescribed by your doctor. Patients and their families should watch out for new or worsening thoughts of suicide or depression. Also watch out for sudden changes in feelings such as feeling anxious, agitated, panicky, irritable, hostile, aggressive, impulsive, severely restless, overly excited and hyperactive, or not being able to sleep. If this happens, especially at the beginning of treatment or after a change in dose, call your health care professional. You may get drowsy or dizzy. Do not drive, use machinery, or do anything that needs mental alertness until you know how this medicine affects you. Do not stand or sit up quickly, especially if you are an older patient. This reduces the risk of dizzy or fainting spells. Alcohol may interfere with the effect of this medicine. Avoid alcoholic drinks. Your mouth may get dry. Chewing sugarless gum or sucking hard candy, and drinking plenty of water will help. Contact your doctor if the problem does not go away or is severe. What side effects may I notice from receiving this medicine? Side effects that you should report to your doctor or health care professional as soon as possible:  allergic reactions like skin rash, itching or hives, swelling of the face, lips, or tongue  anxious  black, tarry stools  breathing problems  changes in vision  chest pain  confusion  elevated mood, decreased need for sleep, racing thoughts, impulsive behavior  eye pain  fast, irregular heartbeat  feeling faint or lightheaded, falls  feeling agitated, angry, or irritable  hallucination, loss of contact with reality  loss of balance or coordination  loss of memory  painful or prolonged  erections  restlessness, pacing, inability to keep still  seizures  stiff muscles  suicidal thoughts or other mood changes  trouble sleeping  unusual bleeding or bruising  unusually weak or tired  vomiting Side effects that usually do not require medical attention (report to your doctor or health care professional if they continue or are bothersome):  change in appetite or weight  change in sex drive or performance  dizziness  headache  increased sweating  indigestion, nausea  tremors This list may not describe all possible side effects. Call your doctor for medical advice about side effects. You may report side effects to FDA at 1-800-FDA-1088. Where should I keep my medicine? Keep out of reach of children. Store at room temperature between 15 and 30 degrees C (59 and 86 degrees F). Throw away any unused medicine after the expiration date. NOTE: This sheet is a summary. It may not cover all possible information. If you have questions about this medicine, talk to your doctor, pharmacist, or health care provider.  2020 Elsevier/Gold Standard (2018-07-16 09:05:36)  

## 2019-10-15 NOTE — Progress Notes (Signed)
Provider:  Larey Seat, MD  Primary Care Physician:  Reynold Bowen, MD [place] [address]     Referring Provider: Reynold Bowen, [address] [place],  [place] [id number]          Chief Complaint according to patient   Patient presents with:    . New Patient (Initial Visit)           HISTORY OF PRESENT ILLNESS:  Mark Christian is a 49 y.o. year old Caucasian male patient seen here in a Rv on 10/15/2019.  We met today to discuss his symptoms and the test results.  Mark Christian is seen here today in the presence of his wife, and we discussed the normal results of the MRI cervical spine with and without contrast.  There is mild disc bulging and some mild uncovertebral spurring also known as bone spurring but no foraminal narrowing no spinal stenosis and no nerve root compression.  The cord appears normal there are no enhancement with contrast.  This is a normal spine for a 49 year old gentleman.  The EMG and nerve conduction study which was performed by my colleague Dr. Annamaria Boots also was normal no fasciculations no radiculopathy and certainly no signs of ALS.  His twitching and insomnia have decreased, his overall anxiety has improved- but with the help of XANAX, which leads me to think that this is an anxiety driven symptom complex.  He attributes his anxiety to Addison's disease. He may be correct.   Xanax bares the risk of addiction, and he had panic attacks.  I would prescribe Citalopram for daily use and wean off xanax.    Seen originally on 09-11-2019. upon referral from dr Forde Dandy for an evaluation of increased fatigue, muscle  twitching and fasciculations, exhaustion.   Chief concern according to patient :  Mr and Mrs. 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  By July 15, 2019 he had for the first time noticed fatigue to a degree that he was not used to.  He felt that  he always had a lot of energy and suddenly there was none.  By 15 December a Covid test was obtained through the city of [place] and returned negative this was only the first of several Covid tests.  All returning negative, he felt flulike, achy and it was suspected that he may have a possible sinusitis.  On December 28 and January 6 he did have another Covid test each time negative on 29 January he underwent antibody testing which also returned negative the symptoms were described as tingling prickly sensations in arms and hands legs and feet below the knee and often are more pronounced when he was seated.  He did not have as much of the sensations on the torso.  His lips began to tingle and he felt that his ability to concentrate and focus was very impaired.  This brain fog has worsened over time.  He feels continuously extremely fatigued not necessarily progressive but also not improving.  He developed a headache and usually does not have 1 his hands and feet became cold and sometimes to the level where he needed a heating pad to become comfortable.  Overall he tended to become shaky and weak throughout his body and has no longer any exercise tolerance comparable to his normal state.  He also reports muscle weakness in all  extremities muscle twitching and pulsating in the right shoulder has been hurting but he did have an injury there in October of last year there is further unexplained left hip pain but also neck and shoulder stiffness he has been using Ambien to get some restful sleep, he is going to the bathroom more often, in the flank and groin he had discovered some swollen lymph nodes, he felt that his vision was at times blurry even with glasses, his handwriting was less accurate and more shaky and minimal activity now will cause him to need rest to recover his level of energy.   He is easily feeling shaky and trembling.     I have the pleasure of seeing Mark Christian today, a right -handed  White or Caucasian male with Addisonian disorder.  he  Carries this diagnosis in 2004, after a crisis in 4 -2004.   has a past medical history of Addison's disease ([place]), GERD (gastroesophageal reflux disease), Hypothyroidism, Right inguinal hernia, and Seasonal allergies., anxiety.        Social history:  Patient is working as a Higher education careers adviser, days, last year he had a lot of extra shifts.  He  lives in a household with 4 persons. Family status is married  with 3 children.  Pets are present. 2 dogs.  Tobacco use; never .  ETOH use ; rare  Caffeine intake in form of Coffee( 2-3 ) Soda( recently had more of them) Tea ( none) -noenergy drinks. Regular exercise - intense physical activity.   Hobbies : yoga.  EMG with dr Krista Blue 09-30-2019.  Selected needle examination of the right upper, lower extremity  muscles; right cervical and lumbosacral paraspinal muscles were  normal   Conclusion:  This is a normal study, there is no electrodiagnostic evidence of  large fiber peripheral neuropathy, right cervical, or lumbosacral  radiculopathy.      Review of Systems: Out of a complete 14 system review, the patient complains of only the following symptoms, and all other reviewed systems are negative.:  Fatigue.  Social History   Socioeconomic History  . Marital status: Married    Spouse name: Not on file  . Number of children: Not on file  . Years of education: Not on file  . Highest education level: Not on file  Occupational History  . Not on file  Tobacco Use  . Smoking status: Never Smoker  . Smokeless tobacco: Never Used  Substance and Sexual Activity  . Alcohol use: Yes    Comment: occassional   . Drug use: No  . Sexual activity: Yes  Other Topics Concern  . Not on file  Social History Narrative   Engineer, structural. Daily caffeine use - 1. Pt gets regular exercise.    Social Determinants of Health   Financial Resource Strain:   . Difficulty of Paying Living Expenses: Not on file    Food Insecurity:   . Worried About Charity fundraiser in the Last Year: Not on file  . Ran Out of Food in the Last Year: Not on file  Transportation Needs:   . Lack of Transportation (Medical): Not on file  . Lack of Transportation (Non-Medical): Not on file  Physical Activity:   . Days of Exercise per Week: Not on file  . Minutes of Exercise per Session: Not on file  Stress:   . Feeling of Stress : Not on file  Social Connections:   . Frequency of Communication with Friends and Family: Not on file  .  Frequency of Social Gatherings with Friends and Family: Not on file  . Attends Religious Services: Not on file  . Active Member of Clubs or Organizations: Not on file  . Attends Archivist Meetings: Not on file  . Marital Status: Not on file    Family History  Problem Relation Age of Onset  . Parkinson's disease Maternal Grandfather   . Dementia Paternal Grandmother   . Colon cancer Neg Hx     Past Medical History:  Diagnosis Date  . Addison's disease ([place])    dx'd in 2004. h/o adrenal crisis in 2004  . GERD (gastroesophageal reflux disease)    OTC as needed  . Hypothyroidism    Addison's Disease  . Right inguinal hernia   . Seasonal allergies     Past Surgical History:  Procedure Laterality Date  . INGUINAL HERNIA REPAIR Right 12/07/2017   Procedure: RIGHT HERNIA REPAIR INGUINAL ADULT WITH MESH;  Surgeon: Rolm Bookbinder, MD;  Location: [facility];  Service: General;  Laterality: Right;  GENERAL WITH TAP BLOCK  . INSERTION OF MESH Right 12/07/2017   Procedure: INSERTION OF MESH;  Surgeon: Rolm Bookbinder, MD;  Location: [facility];  Service: General;  Laterality: Right;  GENERAL WITH TAP BLOCK  . RHINOPLASTY  1970s      Current Outpatient Medications on File Prior to Visit  Medication Sig Dispense Refill  . ALPRAZolam (XANAX) 0.5 MG tablet Take 1 tablet (0.5 mg total) by mouth at bedtime as needed for anxiety. 30 tablet 0   . cetirizine (ZYRTEC) 10 MG tablet Take 10 mg by mouth as needed.      Marland Kitchen FLUDROCORTISONE ACETATE PO Take 0.1 mg by mouth daily. 0.1 mg tabs    . hydrocortisone (CORTEF) 10 MG tablet Take by mouth 2 (two) times daily. Take 20 mg every am and 10 mg every evening     . levothyroxine (SYNTHROID, LEVOTHROID) 150 MCG tablet Take 150 mcg by mouth daily.      . Saccharomyces boulardii (PROBIOTIC) 250 MG CAPS Take by mouth.     Current Facility-Administered Medications on File Prior to Visit  Medication Dose Route Frequency Provider Last Rate Last Admin  . methylPREDNISolone sodium succinate (SOLU-MEDROL) 80 mg in sodium chloride 0.9 % 50 mL IVPB  80 mg Intravenous Once Rolm Bookbinder, MD        Allergies  Allergen Reactions  . Amoxicillin-Pot Clavulanate     Stomach upset    Physical exam:  Today's Vitals   10/15/19 0950  BP: 138/87  Pulse: 76  Temp: (!) 97.2 F (36.2 C)  Weight: 188 lb (85.3 kg)  Height: _0  (1.803 m)   Body mass index is 26.22 kg/m.   Wt Readings from Last 3 Encounters:  10/15/19 188 lb (85.3 kg)  09/11/19 184 lb (83.5 kg)  12/07/17 177 lb 9.6 oz (80.6 kg)     Ht Readings from Last 3 Encounters:  10/15/19 _1  (1.803 m)  09/11/19 _2  (1.803 m)  12/07/17 _3  (1.778 m)      General: The patient is awake, alert and appears not in acute distress. The patient is well groomed. Head: Normocephalic, atraumatic. Neck is supple. Mallampati 2,  neck circumference:17 inches . Nasal airflow  patent.  Retrognathia is not  seen.  Dental status: n/a  Cardiovascular:  Regular rate and cardiac rhythm by pulse,  without distended neck veins. Respiratory: Lungs are clear to auscultation.  Skin:  Without evidence of  ankle edema, or rash. Trunk: The patient's posture is erect.   Neurologic exam : The patient is awake and alert, oriented to place and time.   Memory subjective described as intact.  Attention span & concentration ability appears normal.   Speech is fluent,  without dysarthria, dysphonia or aphasia.  He is much less pressured in his sleep and keeps , maintains a eye contact.  Mood and affect are appropriate.   Cranial nerves: no loss of smell or taste reported  Pupils are equal and briskly reactive to light. Funduscopic exam deferred.   Extraocular movements in vertical and horizontal planes were intact and without nystagmus. No Diplopia. Visual fields by finger perimetry are intact. Hearing was intact.    Facial sensation intact to fine touch.  Facial motor strength is symmetric and tongue and uvula move midline. Tongue is trembling.  Neck ROM : rotation, tilt and flexion extension were normal for age and shoulder shrug was symmetrical.    Motor exam:  Symmetric bulk, tone and ROM.   Normal tone -symmetric grip strength .no fasciculations felt or seen    Sensory:  Fine touch, pinprick and vibration were normal.  Proprioception tested in the upper extremities was normal.   Coordination: Rapid alternating movements in the fingers/hands were of normal speed.  The Finger-to-nose maneuver was intact without evidence of ataxia, dysmetria and with a very low amplitude tremor.    Gait and station: Patient could rise unassisted from a seated position, walked without assistive device.   Deep tendon reflexes: in the  upper and lower extremities are symmetric and intact.  Babinski response was deferred.    CT head in [place] was normal- non contrast. He has slept only 4 hours a night for almost 2 month, he is exhausted. Worried, anxious. Melatonin and Ambien no longer working since the pandemic onset.    After spending a total time of 18 minutes face to face and additional time for physical and neurologic examination, review of laboratory studies,  personal review of imaging studies, reports and results of other testing and review of referral information / records as far as provided in visit, I have established the following  assessments:  1) his fasciculations have decreased, he is less anxious and has slept well , all in response to Xanax.  My Plan is to proceed with:  Citalopram 10 mg daily po in AM.  Xanax 10 tabs - meant to provide panic attack prn use. No refills.    I would like to thank Reynold Bowen, MD. In short, Mark Christian is presenting with that may all relate to anxiety.   I plan to follow up prn.   CC: I will share my notes with PCP.   Electronically signed by: Larey Seat, MD 10/15/2019 9:54 AM  [facility] Board certified by The [employer] of Sleep Medicine and Diplomate of the [employer] of Sleep Medicine. Board certified In Neurology through the [place], Fellow of the [employer] of Neurology. Medical Director of [facility].

## 2019-10-22 ENCOUNTER — Other Ambulatory Visit: Payer: Self-pay | Admitting: Neurology

## 2019-10-25 ENCOUNTER — Other Ambulatory Visit: Payer: Self-pay | Admitting: Neurology

## 2019-10-30 ENCOUNTER — Encounter: Payer: Self-pay | Admitting: Neurology

## 2020-04-06 ENCOUNTER — Other Ambulatory Visit: Payer: Self-pay | Admitting: Neurology

## 2020-04-16 ENCOUNTER — Other Ambulatory Visit: Payer: Self-pay

## 2020-04-16 ENCOUNTER — Ambulatory Visit: Payer: 59 | Admitting: Neurology

## 2020-04-16 ENCOUNTER — Encounter: Payer: Self-pay | Admitting: Neurology

## 2020-04-16 VITALS — BP 143/89 | HR 67 | Ht 71.0 in | Wt 201.0 lb

## 2020-04-16 DIAGNOSIS — R002 Palpitations: Secondary | ICD-10-CM | POA: Diagnosis not present

## 2020-04-16 DIAGNOSIS — F419 Anxiety disorder, unspecified: Secondary | ICD-10-CM

## 2020-04-16 MED ORDER — CITALOPRAM HYDROBROMIDE 10 MG PO TABS: 10.0000 mg | ORAL_TABLET | Freq: Every day | ORAL | 5 refills | Status: DC

## 2020-04-16 MED ORDER — ZOLPIDEM TARTRATE 5 MG PO TABS: 5.0000 mg | ORAL_TABLET | Freq: Every evening | ORAL | 1 refills | Status: DC | PRN

## 2020-04-16 NOTE — Patient Instructions (Signed)
Somatic Symptom Disorder Somatic symptom disorder is a mental health condition. People with this disorder have physical (somatic) symptoms that cause distress or affect daily living. However, no other medical condition can be found to explain these symptoms. Somatic symptom disorder may interfere with relationships, work, school, or other daily activities. It may lead to frequent medical visits and many medical tests to try to find the cause of symptoms. It may also lead to surgical procedures that do not help and can cause serious problems. People with somatic symptom disorder are also at risk for alcohol or drug addiction, suicide attempts, and divorce. Somatic symptom disorder may start in childhood but is most common in young adults. The disorder may be triggered by stressful life events. It may last for several years or may come and go throughout life. What are the causes? The exact cause of this condition is often unknown. What increases the risk? The following factors may make you more likely to develop this condition:  Being male. The disorder is more common in females than males.  Having a history of childhood abuse.  Having a history of alcohol or substance abuse.  Having family members with the disorder.  Having other mental health conditions, including personality disorders. What are the signs or symptoms? You may have somatic symptoms that cannot be explained by a medical condition. Examples of somatic symptoms may include:  Pain. This may be the only physical symptom. Pain may involve any part of the body.  Stomach or intestinal symptoms. These may include nausea, indigestion, diarrhea, or abdominal pain.  Other non-specific symptoms such as fatigue, trouble sleeping, headaches, or dizziness. How is this diagnosed? You may be diagnosed with this condition if:  You have one or more somatic symptoms that cause you distress or affect your daily living.  You react to the  somatic symptoms in a way that is out of proportion to the symptoms. The reaction may include: ? Thinking all the time about the severity of the symptoms. ? Feeling very anxious all the time about the symptoms or your general health. ? Spending a lot of time and energy dealing with the symptoms or health concerns.  You have somatic symptoms either continuously or on and off for at least 6 months. Exams and tests will be done to rule out serious physical health problems. After that, your health care provider may refer you to a mental health specialist for psychological evaluation. Somatic symptoms can be related to a number of mental health conditions. How is this treated? This condition is treated with one or more of the following:  Regular follow-up visits with your health care provider for evaluation and reassurance.  Counseling or talk therapy. This involves working with a mental health specialist to: ? Help you understand what triggers your symptoms. ? Help you learn some coping skills to deal with your symptoms.  Medicine. Certain medicines can help with severe anxiety or depression caused by this disorder.  Healthy lifestyle. A balanced diet and regular exercise can reduce stress and somatic symptoms. Follow these instructions at home: Medicines  Take over-the-counter and prescription medicines only as told by your health care provider. Eating and drinking  Eat a healthy diet that includes plenty of vegetables, fruits, whole grains, low-fat dairy products, and lean protein. Do not eat a lot of foods that are high in solid fats, added sugars, or salt. General instructions  Do not use any products that contain nicotine or tobacco, such as cigarettes and e-cigarettes.   If you need help quitting, ask your health care provider.  Do not use illegal drugs. Do not misuse prescription medicines.  Do not drink alcohol if your health care provider tells you not to.  Get regular exercise.  Most adults should exercise for at least 150 minutes each week. Check with your health care provider before starting an exercise program.  Get the right amount and quality of sleep. Most adults need 7-9 hours of sleep each night.  Keep all follow-up visits as told by your health care provider. This is important. Contact a health care provider if:  Your pain or symptoms do not go away or they become severe.  You develop new symptoms. Get help right away if:  You have serious thoughts about hurting yourself or someone else. If you ever feel like you may hurt yourself or others, or have thoughts about taking your own life, get help right away. You can go to your nearest emergency department or call:  Your local emergency services (911 in the [place]  A suicide crisis helpline, such as the National Suicide Prevention Lifeline at 1-800-273-8255. This is open 24 hours a day. Summary  People with somatic symptom disorder have physical symptoms that cause distress or affect daily living. However, no other medical condition can be found to explain these symptoms.  Somatic symptom disorder may interfere with relationships, work, school, or other daily activities. It may lead to frequent medical visits and many medical tests to try to find the cause of symptoms.  Exams and tests will be done to rule out serious physical health problems. After that, your health care provider may refer you to a mental health specialist for psychological evaluation. This information is not intended to replace advice given to you by your health care provider. Make sure you discuss any questions you have with your health care provider. Document Revised: 09/05/2017 Document Reviewed: 09/05/2017 [employer] Patient Education  2020 [employer]  

## 2020-04-16 NOTE — Progress Notes (Signed)
Provider:  Larey Seat, MD  Primary Care Physician:  Reynold Bowen, MD [place] [address]     Referring Provider: Reynold Bowen, [place] [place],  [place] [address]          Chief Complaint according to patient   Patient presents with:    . New Patient (Initial Visit)           HISTORY OF PRESENT ILLNESS:  Mark Christian is a 49 y.o. year old Caucasian male patient seen here in a Rv on 04/16/2020. 04-16-2020 , here alone. Less twitching , no longer worried about ALS , and less anxious.  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 Citalopram helped to de- fragment his sleep, he still will take an occasional Ambien. He has put on 10 pounds in spite of a rigorous physically active exercise routine.  This can be SSRI related.     We met today to discuss his symptoms and the test results. 10-15-2019: sleep consult. Mark Christian is seen here today in the presence of his wife, and we discussed the normal results of the MRI cervical spine with and without contrast.  There is mild disc bulging and some mild uncovertebral spurring also known as bone spurring but no foraminal narrowing no spinal stenosis and no nerve root compression.  The cord appears normal there are no enhancement with contrast.  This is a normal spine for a 49 year old gentleman.  The EMG and nerve conduction study which was performed by my colleague Dr. Annamaria Boots also was normal no fasciculations no radiculopathy and certainly no signs of ALS.  His twitching and insomnia have decreased, his overall anxiety has improved- but with the help of XANAX, which leads me to think that this is an anxiety driven symptom complex.  He attributes his anxiety to Addison's disease. He may be correct.   Xanax bares the risk of addiction, and he had panic attacks.  I would prescribe Citalopram for daily use and wean off xanax.    Seen originally on 09-11-2019. upon referral from dr  Forde Dandy for an evaluation of increased fatigue, muscle  twitching and fasciculations, exhaustion.   Chief concern according to patient :  Mark and Mrs. 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  By July 15, 2019 he had for the first time noticed fatigue to a degree that he was not used to.  He felt that he always had a lot of energy and suddenly there was none.  By 15 December a Covid test was obtained through the city of [place] and returned negative this was only the first of several Covid tests.  All returning negative, he felt flulike, achy and it was suspected that he may have a possible sinusitis.  On December 28 and January 6 he did have another Covid test each time negative on 29 January he underwent antibody testing which also returned negative the symptoms were described as tingling prickly sensations in arms and hands legs and feet below the knee and often are more pronounced when he was seated.  He did not have as much of the sensations on the torso.  His lips began to tingle and he felt that his ability to concentrate and focus was very impaired.  This brain fog has worsened over time.  He feels continuously extremely fatigued not necessarily progressive  but also not improving.  He developed a headache and usually does not have 1 his hands and feet became cold and sometimes to the level where he needed a heating pad to become comfortable.  Overall he tended to become shaky and weak throughout his body and has no longer any exercise tolerance comparable to his normal state.  He also reports muscle weakness in all extremities muscle twitching and pulsating in the right shoulder has been hurting but he did have an injury there in October of last year there is further unexplained left hip pain but also neck and shoulder stiffness he has been using Ambien to get some restful sleep, he is going to the bathroom more often, in  the flank and groin he had discovered some swollen lymph nodes, he felt that his vision was at times blurry even with glasses, his handwriting was less accurate and more shaky and minimal activity now will cause him to need rest to recover his level of energy.   He is easily feeling shaky and trembling.     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  he  Carries this diagnosis in 2004, after a crisis in 4 -2004.   has a past medical history of Addison's disease (Christian), GERD (gastroesophageal reflux disease), Hypothyroidism, Right inguinal hernia, and Seasonal allergies., anxiety.        Social history:  Patient is working as a Higher education careers adviser, days, last year he had a lot of extra shifts.  He  lives in a household with 4 persons. Family status is married  with 3 children.  Pets are present. 2 dogs.  Tobacco use; never .  ETOH use ; rare  Caffeine intake in form of Coffee( 2-3 ) Soda( recently had more of them) Tea ( none) -noenergy drinks. Regular exercise - intense physical activity.   Hobbies : yoga.  EMG with dr Krista Blue 09-30-2019.  Selected needle examination of the right upper, lower extremity  muscles; right cervical and lumbosacral paraspinal muscles were  normal   Conclusion:  This is a normal study, there is no electrodiagnostic evidence of  large fiber peripheral neuropathy, right cervical, or lumbosacral  radiculopathy.      Review of Systems: Out of a complete 14 system review, the patient complains of only the following symptoms, and all other reviewed systems are negative.:  Fatigue.  Insomnia  Anxiety  "Twitching '  Social History   Socioeconomic History  . Marital status: Married    Spouse name: Not on file  . Number of children: Not on file  . Years of education: Not on file  . Highest education level: Not on file  Occupational History  . Not on file  Tobacco Use  . Smoking status: Never  Smoker  . Smokeless tobacco: Never Used  Substance and Sexual Activity  . Alcohol use: Yes    Comment: occassional   . Drug use: No  . Sexual activity: Yes  Other Topics Concern  . Not on file  Social History Narrative   Engineer, structural. Daily caffeine use - 1. Pt gets regular exercise.    Social Determinants of Health   Financial Resource Strain:   . Difficulty of Paying Living Expenses: Not on file  Food Insecurity:   . Worried About Charity fundraiser in the Last Year: Not on file  . Ran Out of Food in the Last Year: Not on file  Transportation Needs:   . Film/video editor (Medical): Not on file  . Lack of Transportation (Non-Medical): Not on file  Physical Activity:   . Days of Exercise per Week: Not on file  . Minutes of Exercise per Session: Not on file  Stress:   . Feeling of Stress : Not on file  Social Connections:   . Frequency of Communication with Friends and Family: Not on file  . Frequency of Social Gatherings with Friends and Family: Not on file  . Attends Religious Services: Not on file  . Active Member of Clubs or Organizations: Not on file  . Attends Archivist Meetings: Not on file  . Marital Status: Not on file    Family History  Problem Relation Age of Onset  . Parkinson's disease Maternal Grandfather   . Dementia Paternal Grandmother   . Colon cancer Neg Hx     Past Medical History:  Diagnosis Date  . Addison's disease ([place])    dx'd in 2004. h/o adrenal crisis in 2004  . GERD (gastroesophageal reflux disease)    OTC as needed  . Hypothyroidism    Addison's Disease  . Right inguinal hernia   . Seasonal allergies     Past Surgical History:  Procedure Laterality Date  . INGUINAL HERNIA REPAIR Right 12/07/2017   Procedure: RIGHT HERNIA REPAIR INGUINAL ADULT WITH MESH;  Surgeon: Rolm Bookbinder, MD;  Location: [facility];  Service: General;  Laterality: Right;  GENERAL WITH TAP BLOCK  . INSERTION OF MESH Right  12/07/2017   Procedure: INSERTION OF MESH;  Surgeon: Rolm Bookbinder, MD;  Location: [facility];  Service: General;  Laterality: Right;  GENERAL WITH TAP BLOCK  . RHINOPLASTY  1970s      Current Outpatient Medications on File Prior to Visit  Medication Sig Dispense Refill  . citalopram (CELEXA) 10 MG tablet TAKE 1 TABLET BY MOUTH EVERY DAY 30 tablet 5  . FLUDROCORTISONE ACETATE PO Take 0.1 mg by mouth daily. 0.1 mg tabs    . hydrocortisone (CORTEF) 10 MG tablet Take by mouth 2 (two) times daily. Take 20 mg every am and 10 mg every evening     . levothyroxine (SYNTHROID, LEVOTHROID) 150 MCG tablet Take 150 mcg by mouth daily.      . Saccharomyces boulardii (PROBIOTIC) 250 MG CAPS Take by mouth.    . montelukast (SINGULAIR) 10 MG tablet Take 10 mg by mouth daily.    Marland Kitchen zolpidem (AMBIEN) 5 MG tablet Take 5 mg by mouth daily.     Current Facility-Administered Medications on File Prior to Visit  Medication Dose Route Frequency Provider Last Rate Last Admin  . methylPREDNISolone sodium succinate (SOLU-MEDROL) 80 mg in sodium chloride 0.9 % 50 mL IVPB  80 mg Intravenous Once Rolm Bookbinder, MD        Allergies  Allergen Reactions  . Amoxicillin-Pot Clavulanate     Stomach upset    Physical exam:  Today's Vitals   04/16/20 0922  BP: (!) 143/89  Pulse: 67  Weight: 201 lb (91.2 kg)  Height: 5' 11"  (1.803 m)   Body mass index is 28.03 kg/m.   Wt Readings from Last 3 Encounters:  04/16/20 201 lb (91.2 kg)  10/15/19 188 lb (85.3 kg)  09/11/19 184 lb (83.5 kg)     Ht Readings from Last 3 Encounters:  04/16/20 5' 11"  (1.803 m)  10/15/19 5' 11"  (1.803 m)  09/11/19 5' 11"  (1.803 m)  General: The patient is awake, alert and appears not in acute distress. The patient is well groomed. Head: Normocephalic, atraumatic. Neck is supple. Mallampati 2,  neck circumference:17 inches . Nasal airflow patent.   Retrognathia is not seen.  Dental status: n/a    Cardiovascular:  Regular rate and cardiac rhythm by pulse, without distended neck veins. Respiratory: Lungs are clear to auscultation.  Skin:  Without evidence of ankle edema, or rash. Trunk: The patient's posture is erect.   Neurologic exam : The patient is awake and alert, oriented to place and time.   Memory subjective described as intact.  Attention span & concentration ability appears normal.  Speech is fluent, without dysarthria, dysphonia or aphasia.  He is much less pressured in his sleep and keeps , maintains a eye contact.  Mood and affect are appropriate.   Cranial nerves: no loss of smell or taste reported: Pupils are equal and briskly reactive to light.  Extraocular movements in vertical and horizontal planes were intact and without nystagmus. No Diplopia. Visual fields by finger perimetry are intact. Hearing was intact.   Facial sensation intact to fine touch.  Facial motor strength is symmetric and tongue and uvula move midline. Tongue is trembling.  Neck ROM : rotation, tilt and flexion extension were normal for age and shoulder shrug was symmetrical.    Motor exam:    Normal tone -symmetric grip strength .no fasciculations felt or seen    Sensory:  Fine touch, pinprick and vibration were normal.  Proprioception tested in the upper extremities was normal.   Coordination: The Finger-to-nose maneuver was intact without evidence of ataxia, dysmetria and with a very low amplitude tremor.    Gait and station: Patient could rise unassisted from a seated position, walked without assistive device.   Deep tendon reflexes: in the  upper and lower extremities are symmetric and intact.  Babinski response was deferred.    CT head in [place] was normal- non contrast. He has slept only 4 hours a night for almost 2 month, he is exhausted. Worried, anxious. Melatonin and Ambien no longer working since the pandemic onset.    After spending a total time of 18 minutes face to face  and additional time for physical and neurologic examination, review of laboratory studies,  personal review of imaging studies, reports and results of other testing and review of referral information / records as far as provided in visit, I have established the following assessments:  1) his fasciculations have further decreased, he is less anxious and has slept well , all in response to short term Xanax followed up with Citalopram.    My Plan is to proceed with:  Citalopram 10 mg daily po in AM.  Xanax 10 tabs - meant to provide panic attack prn use. No refills.    I would like to thank Reynold Bowen, MD. In short, CAILEN MIHALIK is presenting with that may all relate to anxiety.   I plan to follow up prn.   CC: I will share my notes with PCP.   Electronically signed by: Larey Seat, MD 04/16/2020 9:44 AM  [facility] Board certified by The [employer] of Sleep Medicine and Diplomate of the [employer] of Sleep Medicine. Board certified In Neurology through the [employer], Fellow of the [employer] of Neurology. Medical Director of [facility].

## 2021-04-06 ENCOUNTER — Other Ambulatory Visit: Payer: Self-pay | Admitting: Neurology

## 2021-04-12 ENCOUNTER — Other Ambulatory Visit: Payer: Self-pay | Admitting: Neurology

## 2021-04-13 ENCOUNTER — Other Ambulatory Visit: Payer: Self-pay | Admitting: Neurology

## 2021-04-13 ENCOUNTER — Encounter: Payer: Self-pay | Admitting: Neurology

## 2021-04-13 MED ORDER — CITALOPRAM HYDROBROMIDE 10 MG PO TABS: 10.0000 mg | ORAL_TABLET | Freq: Every day | ORAL | 0 refills | Status: DC

## 2021-04-19 ENCOUNTER — Other Ambulatory Visit: Payer: Self-pay

## 2021-04-19 ENCOUNTER — Ambulatory Visit: Payer: 59 | Admitting: Neurology

## 2021-04-19 ENCOUNTER — Encounter: Payer: Self-pay | Admitting: Neurology

## 2021-04-19 VITALS — BP 138/87 | HR 68 | Ht 71.0 in | Wt 198.5 lb

## 2021-04-19 DIAGNOSIS — F419 Anxiety disorder, unspecified: Secondary | ICD-10-CM

## 2021-04-19 DIAGNOSIS — F5104 Psychophysiologic insomnia: Secondary | ICD-10-CM | POA: Diagnosis not present

## 2021-04-19 MED ORDER — ZOLPIDEM TARTRATE 5 MG PO TABS: 5.0000 mg | ORAL_TABLET | Freq: Every evening | ORAL | 1 refills | Status: DC | PRN

## 2021-04-19 NOTE — Progress Notes (Signed)
Provider:  Larey Seat, MD  Primary Care Physician:  Mark Bowen, MD [place] [id number]     Referring Provider: Reynold Christian, [place],  [place] [address]          Chief Complaint according to patient   Patient presents with:     RV 04-19-2021           HISTORY OF PRESENT ILLNESS:  Mark Christian is a 50 y.o. year old Caucasian male patient seen here in a Rv on 04/19/2021. Here alone. No problems with grip, motor system, balance. ' Here for CPAP compliance follow up.  Mark Christian was seen here today in a follow-up visit as originally he was seen for palpitations, had an EMG with Dr. Mary Christian on 2-22 2021 which was normal.  No large fiber peripheral neuropathy no radiculopathy, no myopathy.  He has been a prn user of Ambien in the past. 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 He has access to psychological care at the workplace.    04-16-2020 , here alone. Less twitching , no longer worried about ALS , and less anxious.  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 Citalopram helped to de- fragment his sleep, he still will take an occasional Ambien. He has put on 10 pounds in spite of a rigorous physically active exercise routine.  This can be SSRI related.     We met today to discuss his symptoms and the test results. 10-15-2019: sleep consult. Mark Christian is seen here today in the presence of his wife, and we discussed the normal results of the MRI cervical spine with and without contrast.  There is mild disc bulging and some mild uncovertebral spurring also known as bone spurring but no foraminal narrowing no spinal stenosis and no nerve root compression.  The cord appears normal there are no enhancement with contrast.  This is a normal spine for a 50 year old gentleman.  The EMG and nerve conduction study which was performed by my  colleague Dr. Annamaria Christian also was normal no fasciculations no radiculopathy and certainly no signs of ALS.  His twitching and insomnia have decreased, his overall anxiety has improved- but with the help of XANAX, which leads me to think that this is an anxiety driven symptom complex.  He attributes his anxiety to Addison's disease. He may be correct.   Xanax bares the risk of addiction, and he had panic attacks.  I would prescribe Citalopram for daily use and wean off xanax.    Seen originally on 09-11-2019. upon referral from dr Forde Christian for an evaluation of increased fatigue, muscle  twitching and fasciculations, exhaustion.   Chief concern according to patient :  Mark and Mrs. 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  By July 15, 2019 he had for the first time noticed fatigue to a degree that he was not used to.  He felt that he always had a lot of energy and suddenly there was none.  By 15 December a Covid test was obtained through the city of [place] and returned negative this was only the first of several Covid tests.  All returning negative, he felt flulike, achy and it was suspected that he may have a possible sinusitis.  On December  28 and January 6 he did have another Covid test each time negative on 29 January he underwent antibody testing which also returned negative the symptoms were described as tingling prickly sensations in arms and hands legs and feet below the knee and often are more pronounced when he was seated.  He did not have as much of the sensations on the torso.  His lips began to tingle and he felt that his ability to concentrate and focus was very impaired.  This brain fog has worsened over time.  He feels continuously extremely fatigued not necessarily progressive but also not improving.  He developed a headache and usually does not have 1 his hands and feet became cold and sometimes to the level where  he needed a heating pad to become comfortable.  Overall he tended to become shaky and weak throughout his body and has no longer any exercise tolerance comparable to his normal state.  He also reports muscle weakness in all extremities muscle twitching and pulsating in the right shoulder has been hurting but he did have an injury there in October of last year there is further unexplained left hip pain but also neck and shoulder stiffness he has been using Ambien to get some restful sleep, he is going to the bathroom more often, in the flank and groin he had discovered some swollen lymph nodes, he felt that his vision was at times blurry even with glasses, his handwriting was less accurate and more shaky and minimal activity now will cause him to need rest to recover his level of energy.   He is easily feeling shaky and trembling.     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  he  Carries this diagnosis in 2004, after a crisis in 4 -2004.   has a past medical history of Addison's disease ([place]), GERD (gastroesophageal reflux disease), Hypothyroidism, Right inguinal hernia, and Seasonal allergies., anxiety.        Social history:  Patient is working as a Higher education careers adviser, days, last year he had a lot of extra shifts.  He  lives in a household with 4 persons. Family status is married  with 3 children.  Pets are present. 2 dogs.  Tobacco use; never .  ETOH use ; rare . Caffeine intake in form of Coffee( 2-3 ) Soda( recently had more of them) Tea ( none) -noenergy drinks. Regular exercise - intense physical activity.   Hobbies : yoga.  EMG with dr Mark Christian 09-30-2019.  Selected needle examination of the right upper, lower extremity  muscles; right cervical and lumbosacral paraspinal muscles were  normal    Conclusion:  This is a normal study, there is no electrodiagnostic evidence of  large fiber peripheral neuropathy, right cervical,  or lumbosacral  radiculopathy.      Review of Systems: Out of a complete 14 system review, the patient complains of only the following symptoms, and all other reviewed systems are negative.:  Fatigue.  How likely are you to doze in the following situations: 0 = not likely, 1 = slight chance, 2 = moderate chance, 3 = high chance  Sitting and Reading? Watching Television? Sitting inactive in a public place (theater or meeting)? Lying down in the afternoon when circumstances permit? Sitting and talking to someone? Sitting quietly after lunch without alcohol? In a car, while stopped for a few minutes in traffic? As a passenger in a car for an hour  without a break?  Total = 7/ 24    Insomnia  Anxiety  "Twitching '  Social History   Socioeconomic History   Marital status: Married    Spouse name: Not on file   Number of children: Not on file   Years of education: Not on file   Highest education level: Not on file  Occupational History   Not on file  Tobacco Use   Smoking status: Never   Smokeless tobacco: Never  Substance and Sexual Activity   Alcohol use: Yes    Comment: occassional    Drug use: No   Sexual activity: Yes  Other Topics Concern   Not on file  Social History Narrative   Engineer, structural. Daily caffeine use - 1. Pt gets regular exercise.    Social Determinants of Health   Financial Resource Strain: Not on file  Food Insecurity: Not on file  Transportation Needs: Not on file  Physical Activity: Not on file  Stress: Not on file  Social Connections: Not on file    Family History  Problem Relation Age of Onset   Parkinson's disease Maternal Grandfather    Dementia Paternal Grandmother    Colon cancer Neg Hx     Past Medical History:  Diagnosis Date   Addison's disease ([place])    dx'd in 2004. h/o adrenal crisis in 2004   GERD (gastroesophageal reflux disease)    OTC as needed   Hypothyroidism    Addison's Disease   Right inguinal hernia     Seasonal allergies     Past Surgical History:  Procedure Laterality Date   INGUINAL HERNIA REPAIR Right 12/07/2017   Procedure: RIGHT HERNIA REPAIR INGUINAL ADULT WITH MESH;  Surgeon: Rolm Bookbinder, MD;  Location: [facility];  Service: General;  Laterality: Right;  GENERAL WITH TAP BLOCK   INSERTION OF MESH Right 12/07/2017   Procedure: INSERTION OF MESH;  Surgeon: Rolm Bookbinder, MD;  Location: [facility];  Service: General;  Laterality: Right;  GENERAL WITH TAP BLOCK   RHINOPLASTY  1970s      Current Outpatient Medications on File Prior to Visit  Medication Sig Dispense Refill   citalopram (CELEXA) 10 MG tablet Take 1 tablet (10 mg total) by mouth daily. 30 tablet 0   FLUDROCORTISONE ACETATE PO Take 0.1 mg by mouth daily. 0.1 mg tabs     hydrocortisone (CORTEF) 10 MG tablet Take by mouth 2 (two) times daily. Take 20 mg every am and 10 mg every evening      levothyroxine (SYNTHROID, LEVOTHROID) 150 MCG tablet Take 150 mcg by mouth daily.       zolpidem (AMBIEN) 5 MG tablet Take 1 tablet (5 mg total) by mouth at bedtime as needed for sleep. 30 tablet 1   montelukast (SINGULAIR) 10 MG tablet Take 10 mg by mouth daily. (Patient not taking: Reported on 04/19/2021)     Current Facility-Administered Medications on File Prior to Visit  Medication Dose Route Frequency Provider Last Rate Last Admin   methylPREDNISolone sodium succinate (SOLU-MEDROL) 80 mg in sodium chloride 0.9 % 50 mL IVPB  80 mg Intravenous Once Rolm Bookbinder, MD        Allergies  Allergen Reactions   Amoxicillin-Pot Clavulanate     Stomach upset    Physical exam:  Today's Vitals   04/19/21 0932  BP: 138/87  Pulse: 68  Weight: 198 lb 8 oz (90 kg)  Height: _0  (1.803 m)   Body mass index  is 27.69 kg/m.   Wt Readings from Last 3 Encounters:  04/19/21 198 lb 8 oz (90 kg)  04/16/20 201 lb (91.2 kg)  10/15/19 188 lb (85.3 kg)     Ht Readings from Last 3 Encounters:   04/19/21 _0  (1.803 m)  04/16/20 _1  (1.803 m)  10/15/19 _2  (1.803 m)      General: The patient is awake, alert and appears not in acute distress. The patient is well groomed. Head: Normocephalic, atraumatic. Neck is supple. Mallampati 2,  neck circumference:17 inches . Nasal airflow patent.   Retrognathia is not seen.  Dental status: n/a  Cardiovascular:  Regular rate and cardiac rhythm by pulse, without distended neck veins. Respiratory: Lungs are clear to auscultation.  Skin:  Without evidence of ankle edema, or rash. Trunk: The patient's posture is erect.   Neurologic exam : The patient is awake and alert, oriented to place and time.   Memory subjective described as intact.  Attention span & concentration ability appears normal.  Speech is fluent, without dysarthria, dysphonia or aphasia.  He is much less pressured in his sleep and keeps , maintains a eye contact.  Mood and affect are appropriate.   Cranial nerves: no loss of smell or taste reported: Pupils are equal and briskly reactive to light.  Extraocular movements in vertical and horizontal planes were intact and without nystagmus. No Diplopia.  Facial motor strength is symmetric and tongue and uvula move midline. Tongue is trembling.  Neck ROM : rotation, tilt and flexion extension were normal for age and shoulder shrug was symmetrical.    Motor exam:    Normal tone -symmetric grip strength .no fasciculations felt or seen    Sensory:  Fine touch, pinprick and vibration were normal.  Proprioception tested in the upper extremities was normal.   Coordination: The Finger-to-nose maneuver was intact without evidence of ataxia, dysmetria and with a very low amplitude tremor.    Gait and station: Patient could rise unassisted from a seated position, walked without assistive device.   Deep tendon reflexes: in the  upper and lower extremities are symmetric and intact.  Babinski response was deferred.   He is  doing well on PRN 2.5 mg Ambien, celexa let to some weight gain, 8-10 pounds.     After spending a total time of 21 minutes face to face and additional time for physical and neurologic examination, review of laboratory studies,  personal review of imaging studies, reports and results of other testing and review of referral information / records as far as provided in visit, I have established the following assessments:  1) his fasciculations have further decreased, he is less anxious and has slept well . My Plan is to proceed with:  Citalopram 10 mg daily po in AM.  Xanax 2.5 mg - meant to provide panic attack prn use.  No refills.  Addison's disease followed by endocrinologist, dr Forde Christian.    I would like to thank Mark Bowen, MD. In short, Mark Christian is presenting with that may all relate to anxiety.   I plan to follow up prn.   CC: I will share my notes with PCP.   Electronically signed by: Mark Seat, MD 04/19/2021 9:48 AM  [facility] Board certified by The [employer] of Sleep Medicine and Diplomate of the [employer] of Sleep Medicine. Board certified In Neurology through the [employer], Fellow of the [employer] of Neurology. Medical Director of [employer].

## 2021-04-19 NOTE — Patient Instructions (Signed)
Insomnia Insomnia is a sleep disorder that makes it difficult to fall asleep or stay asleep. Insomnia can cause fatigue, low energy, difficulty concentrating, mood swings, and poor performance at work or school. There are three different ways to classify insomnia: Difficulty falling asleep. Difficulty staying asleep. Waking up too early in the morning. Any type of insomnia can be long-term (chronic) or short-term (acute). Both are common. Short-term insomnia usually lasts for three months or less. Chronic insomnia occurs at least three times a week for longer than three months. What are the causes? Insomnia may be caused by another condition, situation, or substance, such as: Anxiety. Certain medicines. Gastroesophageal reflux disease (GERD) or other gastrointestinal conditions. Asthma or other breathing conditions. Restless legs syndrome, sleep apnea, or other sleep disorders. Chronic pain. Menopause. Stroke. Abuse of alcohol, tobacco, or illegal drugs. Mental health conditions, such as depression. Caffeine. Neurological disorders, such as Alzheimer's disease. An overactive thyroid (hyperthyroidism). Sometimes, the cause of insomnia may not be known. What increases the risk? Risk factors for insomnia include: Gender. Women are affected more often than men. Age. Insomnia is more common as you get older. Stress. Lack of exercise. Irregular work schedule or working night shifts. Traveling between different time zones. Certain medical and mental health conditions. What are the signs or symptoms? If you have insomnia, the main symptom is having trouble falling asleep or having trouble staying asleep. This may lead to other symptoms, such as: Feeling fatigued or having low energy. Feeling nervous about going to sleep. Not feeling rested in the morning. Having trouble concentrating. Feeling irritable, anxious, or depressed. How is this diagnosed? This condition may be diagnosed  based on: Your symptoms and medical history. Your health care provider may ask about: Your sleep habits. Any medical conditions you have. Your mental health. A physical exam. How is this treated? Treatment for insomnia depends on the cause. Treatment may focus on treating an underlying condition that is causing insomnia. Treatment may also include: Medicines to help you sleep. Counseling or therapy. Lifestyle adjustments to help you sleep better. Follow these instructions at home: Eating and drinking  Limit or avoid alcohol, caffeinated beverages, and cigarettes, especially close to bedtime. These can disrupt your sleep. Do not eat a large meal or eat spicy foods right before bedtime. This can lead to digestive discomfort that can make it hard for you to sleep. Sleep habits  Keep a sleep diary to help you and your health care provider figure out what could be causing your insomnia. Write down: When you sleep. When you wake up during the night. How well you sleep. How rested you feel the next day. Any side effects of medicines you are taking. What you eat and drink. Make your bedroom a dark, comfortable place where it is easy to fall asleep. Put up shades or blackout curtains to block light from outside. Use a white noise machine to block noise. Keep the temperature cool. Limit screen use before bedtime. This includes: Watching TV. Using your smartphone, tablet, or computer. Stick to a routine that includes going to bed and waking up at the same times every day and night. This can help you fall asleep faster. Consider making a quiet activity, such as reading, part of your nighttime routine. Try to avoid taking naps during the day so that you sleep better at night. Get out of bed if you are still awake after 15 minutes of trying to sleep. Keep the lights down, but try reading or doing a  quiet activity. When you feel sleepy, go back to bed. General instructions Take over-the-counter  and prescription medicines only as told by your health care provider. Exercise regularly, as told by your health care provider. Avoid exercise starting several hours before bedtime. Use relaxation techniques to manage stress. Ask your health care provider to suggest some techniques that may work well for you. These may include: Breathing exercises. Routines to release muscle tension. Visualizing peaceful scenes. Make sure that you drive carefully. Avoid driving if you feel very sleepy. Keep all follow-up visits as told by your health care provider. This is important. Contact a health care provider if: You are tired throughout the day. You have trouble in your daily routine due to sleepiness. You continue to have sleep problems, or your sleep problems get worse. Get help right away if: You have serious thoughts about hurting yourself or someone else. If you ever feel like you may hurt yourself or others, or have thoughts about taking your own life, get help right away. You can go to your nearest emergency department or call: Your local emergency services (911 in the [place] A suicide crisis helpline, such as the National Suicide Prevention Lifeline at (802) 151-2542. This is open 24 hours a day. Summary Insomnia is a sleep disorder that makes it difficult to fall asleep or stay asleep. Insomnia can be long-term (chronic) or short-term (acute). Treatment for insomnia depends on the cause. Treatment may focus on treating an underlying condition that is causing insomnia. Keep a sleep diary to help you and your health care provider figure out what could be causing your insomnia. This information is not intended to replace advice given to you by your health care provider. Make sure you discuss any questions you have with your health care provider. Document Revised: 06/04/2020 Document Reviewed: 06/04/2020 Elsevier Patient Education  2022 [employer]. Addison's Disease Addison's disease, which is  also called primary adrenal insufficiency, is a condition in which the adrenal glands do not make enough of the hormones cortisol and aldosterone. The two adrenal glands are located above each kidney. The disease causes blood pressure to drop and causes potassium to build up to dangerous levels. If Addison's disease is not treated, it can suddenly get worse and become life-threatening. This is called an adrenal crisis(addisonian crisis). What are the causes? This condition may be caused by: A disease in which the body's immune system damages the adrenal glands. An infection of the adrenal glands. Bleeding (hemorrhage) in the adrenal glands. A tumor. What are the signs or symptoms? Common symptoms of this condition include: Severe fatigue. Muscle weakness. Loss of appetite. Weight loss. Darkening of the skin. Low blood pressure. Other symptoms include: Nausea or vomiting. Diarrhea. Dizziness or fainting. Irritability. Depression. Salt cravings. Low blood sugar. Irregular or no menstrual periods. Symptoms usually develop slowly and get worse gradually. How is this diagnosed? This condition may be diagnosed based on your: Medical history. Symptoms. Lab test results. Lab tests include a measurement of your blood cortisol levels (ACTH stimulation test). Imaging test results. You may have a CT scan of the adrenal glands. How is this treated? This condition cannot be cured, but it can be managed with medicines that replace cortisol and aldosterone. You may need to take these medicines: Several times a day, by mouth. Through an injection if you become so sick that you are unable to take these medicines by mouth or you are unable to keep them down. Illness, stress, and surgery can increase your body's  need for cortisol. It is very important that you talk with your health care provider and understand how to adjust your medicine dosages if you become ill or stressed or if you are going to  have surgery. Follow these instructions at home: Medicines Take over-the-counter and prescription medicines only as told by your health care provider. Know how to increase your medicine dosage during periods of stress, mild illness, or surgery. General instructions  When you travel, carry a needle, a syringe, and an injectable form of cortisol in case of an emergency. In case of an emergency, wear a medical alert bracelet or neck chain so that others understand that you have Addison's disease. Carry an ID card that states that you have Addison's disease. The card should include: Instructions to inject a certain amount of medicine if you are severely hurt or cannot respond. The name and phone number of your health care provider. The name and phone number of your closest relative. Keep all follow-up visits. This is important. Contact a health care provider if: You get sick with another illness. You develop new symptoms. Get help right away if: You have a severe infection or other illness. You have severe vomiting or diarrhea. You find it necessary to give yourself injectable medicine. You have symptoms of an addisoniancrisis. These symptoms include: Sudden, severe pain in the lower back, abdomen, or legs. Severe vomiting and diarrhea. Dehydration. Low blood pressure. Loss of consciousness. Summary Addison's disease is the inability of the adrenal glands to make hormones that regulate everyday functions of the body. If untreated, this condition can lead to life-threatening problems. It is very important that you talk with your health care provider and understand how to adjust your medicine dosages if you become ill or stressed or if you are going to have surgery. Take over-the-counter and prescription medicines only as told by your health care provider. Keep all follow-up visits. This is important. This information is not intended to replace advice given to you by your health care  provider. Make sure you discuss any questions you have with your health care provider. Document Revised: 04/06/2020 Document Reviewed: 04/06/2020 Elsevier Patient Education  2022 [employer].

## 2021-05-12 ENCOUNTER — Other Ambulatory Visit: Payer: Self-pay | Admitting: Neurology

## 2021-08-16 ENCOUNTER — Other Ambulatory Visit: Payer: Self-pay | Admitting: Endocrinology

## 2021-08-16 ENCOUNTER — Ambulatory Visit
Admission: RE | Admit: 2021-08-16 | Discharge: 2021-08-16 | Disposition: A | Discharge status: Home / Self Care | Payer: 59 | Source: Ambulatory Visit | Attending: Endocrinology | Admitting: Endocrinology

## 2021-08-16 DIAGNOSIS — N200 Calculus of kidney: Secondary | ICD-10-CM

## 2021-08-26 ENCOUNTER — Other Ambulatory Visit: Payer: Self-pay | Admitting: Neurology

## 2021-08-26 NOTE — Telephone Encounter (Signed)
Received refill request for Ambien.  Last OV was on 04/19/21.  Next OV is scheduled for 04/19/22 .  Last RX was written on 07/03/21 for 30 tabs.   [place] [facility] Database has been reviewed.

## 2022-02-17 ENCOUNTER — Other Ambulatory Visit: Payer: Self-pay | Admitting: Neurology

## 2022-02-17 NOTE — Telephone Encounter (Signed)
Last OV was on 04/19/21.  Next OV is scheduled for 04/19/22.  Last RX was written on 12/20/21 for 30 tabs.   [place] [facility] Database has been reviewed.

## 2022-02-21 ENCOUNTER — Other Ambulatory Visit: Payer: Self-pay

## 2022-02-21 ENCOUNTER — Encounter (HOSPITAL_BASED_OUTPATIENT_CLINIC_OR_DEPARTMENT_OTHER): Payer: Self-pay | Admitting: Obstetrics and Gynecology

## 2022-02-21 ENCOUNTER — Ambulatory Visit (HOSPITAL_BASED_OUTPATIENT_CLINIC_OR_DEPARTMENT_OTHER)
Admission: EM | Admit: 2022-02-21 | Discharge: 2022-02-21 | Disposition: A | Discharge status: Home / Self Care | Payer: 59 | Attending: Surgery | Admitting: Surgery

## 2022-02-21 ENCOUNTER — Emergency Department (HOSPITAL_BASED_OUTPATIENT_CLINIC_OR_DEPARTMENT_OTHER): Payer: 59

## 2022-02-21 ENCOUNTER — Emergency Department (HOSPITAL_BASED_OUTPATIENT_CLINIC_OR_DEPARTMENT_OTHER): Payer: 59 | Admitting: Anesthesiology

## 2022-02-21 ENCOUNTER — Encounter (HOSPITAL_COMMUNITY)
Admission: EM | Disposition: A | Discharge status: Home / Self Care | Payer: Self-pay | Source: Home / Self Care | Attending: Emergency Medicine

## 2022-02-21 ENCOUNTER — Emergency Department (HOSPITAL_COMMUNITY): Payer: 59 | Admitting: Anesthesiology

## 2022-02-21 DIAGNOSIS — K3580 Unspecified acute appendicitis: Secondary | ICD-10-CM | POA: Diagnosis not present

## 2022-02-21 DIAGNOSIS — E039 Hypothyroidism, unspecified: Secondary | ICD-10-CM | POA: Diagnosis not present

## 2022-02-21 DIAGNOSIS — K5730 Diverticulosis of large intestine without perforation or abscess without bleeding: Secondary | ICD-10-CM | POA: Insufficient documentation

## 2022-02-21 DIAGNOSIS — E271 Primary adrenocortical insufficiency: Secondary | ICD-10-CM | POA: Diagnosis not present

## 2022-02-21 DIAGNOSIS — N200 Calculus of kidney: Secondary | ICD-10-CM | POA: Insufficient documentation

## 2022-02-21 DIAGNOSIS — K353 Acute appendicitis with localized peritonitis, without perforation or gangrene: Secondary | ICD-10-CM

## 2022-02-21 HISTORY — DX: Anxiety disorder, unspecified: F41.9

## 2022-02-21 HISTORY — PX: LAPAROSCOPIC APPENDECTOMY: SHX408

## 2022-02-21 LAB — COMPREHENSIVE METABOLIC PANEL
ALT: 17 U/L (ref 0–44)
AST: 15 U/L (ref 15–41)
Albumin: 4.6 g/dL (ref 3.5–5.0)
Alkaline Phosphatase: 59 U/L (ref 38–126)
Anion gap: 9 (ref 5–15)
BUN: 17 mg/dL (ref 6–20)
CO2: 31 mmol/L (ref 22–32)
Calcium: 10.2 mg/dL (ref 8.9–10.3)
Chloride: 100 mmol/L (ref 98–111)
Creatinine, Ser: 1.33 mg/dL — ABNORMAL HIGH (ref 0.61–1.24)
GFR, Estimated: >60 mL/min (ref >60)
Glucose, Bld: 98 mg/dL (ref 70–99)
Potassium: 4 mmol/L (ref 3.5–5.1)
Sodium: 140 mmol/L (ref 135–145)
Total Bilirubin: 0.8 mg/dL (ref 0.3–1.2)
Total Protein: 7.1 g/dL (ref 6.5–8.1)

## 2022-02-21 LAB — CBC
HCT: 48.2 % (ref 39.0–52.0)
Hemoglobin: 15.6 g/dL (ref 13.0–17.0)
MCH: 26 pg (ref 26.0–34.0)
MCHC: 32.4 g/dL (ref 30.0–36.0)
MCV: 80.2 fL (ref 80.0–100.0)
Platelets: 305 10*3/uL (ref 150–400)
RBC: 6.01 MIL/uL — ABNORMAL HIGH (ref 4.22–5.81)
RDW: 14.1 % (ref 11.5–15.5)
WBC: 10.8 10*3/uL — ABNORMAL HIGH (ref 4.0–10.5)
nRBC: 0 % (ref 0.0–0.2)

## 2022-02-21 LAB — URINALYSIS, ROUTINE W REFLEX MICROSCOPIC
Bilirubin Urine: NEGATIVE
Glucose, UA: NEGATIVE mg/dL
Hgb urine dipstick: NEGATIVE
Ketones, ur: NEGATIVE mg/dL
Leukocytes,Ua: NEGATIVE
Nitrite: NEGATIVE
Specific Gravity, Urine: 1.026 (ref 1.005–1.030)
pH: 6.5 (ref 5.0–8.0)

## 2022-02-21 LAB — LIPASE, BLOOD: Lipase: 38 U/L (ref 11–51)

## 2022-02-21 SURGERY — APPENDECTOMY, LAPAROSCOPIC
Anesthesia: General | Site: Abdomen

## 2022-02-21 MED ORDER — MIDAZOLAM HCL 2 MG/2ML IJ SOLN
INTRAMUSCULAR | Status: AC
  Filled 2022-02-21: qty 2

## 2022-02-21 MED ORDER — MEPERIDINE HCL 25 MG/ML IJ SOLN: 6.2500 mg | INTRAMUSCULAR | Status: DC | PRN

## 2022-02-21 MED ORDER — FENTANYL CITRATE (PF) 250 MCG/5ML IJ SOLN
INTRAMUSCULAR | Status: AC
  Filled 2022-02-21: qty 5

## 2022-02-21 MED ORDER — METRONIDAZOLE 500 MG/100ML IV SOLN
500.0000 mg | Freq: Once | INTRAVENOUS | Status: AC
  Administered 2022-02-21: 500 mg via INTRAVENOUS
  Filled 2022-02-21: qty 100

## 2022-02-21 MED ORDER — 0.9 % SODIUM CHLORIDE (POUR BTL) OPTIME
TOPICAL | Status: DC | PRN
  Administered 2022-02-21: 1000 mL

## 2022-02-21 MED ORDER — BUPIVACAINE-EPINEPHRINE (PF) 0.25% -1:200000 IJ SOLN
INTRAMUSCULAR | Status: AC
  Filled 2022-02-21: qty 30

## 2022-02-21 MED ORDER — IOHEXOL 300 MG/ML  SOLN
100.0000 mL | Freq: Once | INTRAMUSCULAR | Status: AC | PRN
Start: 2022-02-21 — End: 2022-02-21
  Administered 2022-02-21: 85 mL via INTRAVENOUS

## 2022-02-21 MED ORDER — ACETAMINOPHEN 10 MG/ML IV SOLN
INTRAVENOUS | Status: AC
  Filled 2022-02-21: qty 100

## 2022-02-21 MED ORDER — SODIUM CHLORIDE 0.9 % IR SOLN
Status: DC | PRN
  Administered 2022-02-21: 1000 mL

## 2022-02-21 MED ORDER — OXYCODONE HCL 5 MG PO TABS: 5.0000 mg | ORAL_TABLET | Freq: Four times a day (QID) | ORAL | 0 refills | Status: AC | PRN

## 2022-02-21 MED ORDER — LACTATED RINGERS IV SOLN
INTRAVENOUS | Status: DC
Start: 2022-02-21 — End: 2022-02-21

## 2022-02-21 MED ORDER — ONDANSETRON HCL 4 MG/2ML IJ SOLN
INTRAMUSCULAR | Status: DC | PRN
  Administered 2022-02-21: 4 mg via INTRAVENOUS

## 2022-02-21 MED ORDER — MIDAZOLAM HCL 2 MG/2ML IJ SOLN
INTRAMUSCULAR | Status: DC | PRN
  Administered 2022-02-21: 2 mg via INTRAVENOUS

## 2022-02-21 MED ORDER — FENTANYL CITRATE (PF) 250 MCG/5ML IJ SOLN
INTRAMUSCULAR | Status: DC | PRN
  Administered 2022-02-21: 100 ug via INTRAVENOUS
  Administered 2022-02-21: 50 ug via INTRAVENOUS
  Administered 2022-02-21: 100 ug via INTRAVENOUS

## 2022-02-21 MED ORDER — ROCURONIUM BROMIDE 10 MG/ML (PF) SYRINGE
PREFILLED_SYRINGE | INTRAVENOUS | Status: DC | PRN
  Administered 2022-02-21: 40 mg via INTRAVENOUS

## 2022-02-21 MED ORDER — PROPOFOL 10 MG/ML IV BOLUS
INTRAVENOUS | Status: AC
  Filled 2022-02-21: qty 20

## 2022-02-21 MED ORDER — CHLORHEXIDINE GLUCONATE 0.12 % MT SOLN
OROMUCOSAL | Status: AC
  Administered 2022-02-21: 15 mL via OROMUCOSAL
  Filled 2022-02-21: qty 15

## 2022-02-21 MED ORDER — HYDROMORPHONE HCL 1 MG/ML IJ SOLN
INTRAMUSCULAR | Status: AC
  Filled 2022-02-21: qty 1

## 2022-02-21 MED ORDER — PHENYLEPHRINE 80 MCG/ML (10ML) SYRINGE FOR IV PUSH (FOR BLOOD PRESSURE SUPPORT)
PREFILLED_SYRINGE | INTRAVENOUS | Status: DC | PRN
  Administered 2022-02-21 (×2): 80 ug via INTRAVENOUS

## 2022-02-21 MED ORDER — ONDANSETRON HCL 4 MG/2ML IJ SOLN: 4.0000 mg | INTRAMUSCULAR | Status: DC | PRN

## 2022-02-21 MED ORDER — PHENYLEPHRINE 80 MCG/ML (10ML) SYRINGE FOR IV PUSH (FOR BLOOD PRESSURE SUPPORT)
PREFILLED_SYRINGE | INTRAVENOUS | Status: AC
Start: 2022-02-21 — End: ?
  Filled 2022-02-21: qty 10

## 2022-02-21 MED ORDER — PROMETHAZINE HCL 25 MG/ML IJ SOLN: 6.2500 mg | INTRAMUSCULAR | Status: DC | PRN

## 2022-02-21 MED ORDER — ACETAMINOPHEN 500 MG PO TABS: 1000.0000 mg | ORAL_TABLET | Freq: Three times a day (TID) | ORAL | 0 refills | Status: AC | PRN

## 2022-02-21 MED ORDER — HYDROCORTISONE SOD SUC (PF) 100 MG IJ SOLR
INTRAMUSCULAR | Status: DC | PRN
  Administered 2022-02-21: 50 mg via INTRAVENOUS

## 2022-02-21 MED ORDER — SUGAMMADEX SODIUM 200 MG/2ML IV SOLN
INTRAVENOUS | Status: DC | PRN
  Administered 2022-02-21: 300 mg via INTRAVENOUS

## 2022-02-21 MED ORDER — LIDOCAINE 2% (20 MG/ML) 5 ML SYRINGE
INTRAMUSCULAR | Status: AC
  Filled 2022-02-21: qty 5

## 2022-02-21 MED ORDER — PROPOFOL 10 MG/ML IV BOLUS
INTRAVENOUS | Status: DC | PRN
  Administered 2022-02-21: 200 mg via INTRAVENOUS

## 2022-02-21 MED ORDER — SUCCINYLCHOLINE CHLORIDE 200 MG/10ML IV SOSY
PREFILLED_SYRINGE | INTRAVENOUS | Status: DC | PRN
  Administered 2022-02-21: 80 mg via INTRAVENOUS

## 2022-02-21 MED ORDER — KETOROLAC TROMETHAMINE 30 MG/ML IJ SOLN
INTRAMUSCULAR | Status: AC
  Filled 2022-02-21: qty 1

## 2022-02-21 MED ORDER — CHLORHEXIDINE GLUCONATE 0.12 % MT SOLN: 15.0000 mL | Freq: Once | OROMUCOSAL | Status: AC

## 2022-02-21 MED ORDER — ONDANSETRON HCL 4 MG/2ML IJ SOLN
INTRAMUSCULAR | Status: AC
  Filled 2022-02-21: qty 2

## 2022-02-21 MED ORDER — ACETAMINOPHEN 10 MG/ML IV SOLN
INTRAVENOUS | Status: DC | PRN
  Administered 2022-02-21: 1000 mg via INTRAVENOUS

## 2022-02-21 MED ORDER — LIDOCAINE 2% (20 MG/ML) 5 ML SYRINGE
INTRAMUSCULAR | Status: DC | PRN
  Administered 2022-02-21: 100 mg via INTRAVENOUS

## 2022-02-21 MED ORDER — SODIUM CHLORIDE 0.9 % IV SOLN
2.0000 g | Freq: Once | INTRAVENOUS | Status: AC
  Administered 2022-02-21: 2 g via INTRAVENOUS
  Filled 2022-02-21: qty 20

## 2022-02-21 MED ORDER — KETOROLAC TROMETHAMINE 30 MG/ML IJ SOLN
INTRAMUSCULAR | Status: DC | PRN
  Administered 2022-02-21: 30 mg via INTRAVENOUS

## 2022-02-21 MED ORDER — POLYETHYLENE GLYCOL 3350 17 G PO PACK
17.0000 g | PACK | Freq: Every day | ORAL | 0 refills | Status: AC | PRN
Start: 2022-02-21 — End: ?

## 2022-02-21 MED ORDER — ROCURONIUM BROMIDE 10 MG/ML (PF) SYRINGE
PREFILLED_SYRINGE | INTRAVENOUS | Status: AC
  Filled 2022-02-21: qty 10

## 2022-02-21 MED ORDER — ONDANSETRON HCL 4 MG/2ML IJ SOLN
4.0000 mg | Freq: Once | INTRAMUSCULAR | Status: AC
  Administered 2022-02-21: 4 mg via INTRAVENOUS
  Filled 2022-02-21: qty 2

## 2022-02-21 MED ORDER — MORPHINE SULFATE (PF) 2 MG/ML IV SOLN: 1.0000 mg | INTRAVENOUS | Status: DC | PRN

## 2022-02-21 MED ORDER — SUCCINYLCHOLINE CHLORIDE 200 MG/10ML IV SOSY
PREFILLED_SYRINGE | INTRAVENOUS | Status: AC
  Filled 2022-02-21: qty 10

## 2022-02-21 MED ORDER — HYDROMORPHONE HCL 1 MG/ML IJ SOLN
0.2500 mg | INTRAMUSCULAR | Status: DC | PRN
  Administered 2022-02-21: 0.5 mg via INTRAVENOUS

## 2022-02-21 MED ORDER — SODIUM CHLORIDE 0.9 % IV BOLUS
500.0000 mL | Freq: Once | INTRAVENOUS | Status: AC
  Administered 2022-02-21: 500 mL via INTRAVENOUS

## 2022-02-21 MED ORDER — ORAL CARE MOUTH RINSE
15.0000 mL | Freq: Once | OROMUCOSAL | Status: AC
Start: 2022-02-21 — End: 2022-02-21

## 2022-02-21 SURGICAL SUPPLY — 46 items
APPLIER CLIP ROT 10 11.4 M/L (STAPLE)
BAG COUNTER SPONGE SURGICOUNT (BAG) ×2 IMPLANT
BENZOIN TINCTURE PRP APPL 2/3 (GAUZE/BANDAGES/DRESSINGS) ×2 IMPLANT
BLADE CLIPPER SURG (BLADE) IMPLANT
CANISTER SUCT 3000ML PPV (MISCELLANEOUS) IMPLANT
CHLORAPREP W/TINT 26 (MISCELLANEOUS) ×2 IMPLANT
CLIP APPLIE ROT 10 11.4 M/L (STAPLE) IMPLANT
COVER SURGICAL LIGHT HANDLE (MISCELLANEOUS) ×2 IMPLANT
CUTTER FLEX LINEAR 45M (STAPLE) ×2 IMPLANT
DRSG TEGADERM 2-3/8X2-3/4 SM (GAUZE/BANDAGES/DRESSINGS) ×5 IMPLANT
DRSG TEGADERM 4X4.75 (GAUZE/BANDAGES/DRESSINGS) ×2 IMPLANT
ELECT REM PT RETURN 9FT ADLT (ELECTROSURGICAL) ×2
ELECTRODE REM PT RTRN 9FT ADLT (ELECTROSURGICAL) ×1 IMPLANT
ENDOLOOP SUT PDS II  0 18 (SUTURE)
ENDOLOOP SUT PDS II 0 18 (SUTURE) IMPLANT
GAUZE SPONGE 2X2 8PLY STRL LF (GAUZE/BANDAGES/DRESSINGS) ×1 IMPLANT
GLOVE BIO SURGEON STRL SZ7 (GLOVE) ×2 IMPLANT
GLOVE BIOGEL PI IND STRL 7.5 (GLOVE) ×1 IMPLANT
GLOVE BIOGEL PI INDICATOR 7.5 (GLOVE) ×1
GOWN STRL REUS W/ TWL LRG LVL3 (GOWN DISPOSABLE) ×3 IMPLANT
GOWN STRL REUS W/TWL LRG LVL3 (GOWN DISPOSABLE) ×6
KIT BASIN OR (CUSTOM PROCEDURE TRAY) ×2 IMPLANT
KIT TURNOVER KIT B (KITS) ×2 IMPLANT
NS IRRIG 1000ML POUR BTL (IV SOLUTION) ×2 IMPLANT
PAD ARMBOARD 7.5X6 YLW CONV (MISCELLANEOUS) ×4 IMPLANT
POUCH SPECIMEN RETRIEVAL 10MM (ENDOMECHANICALS) ×2 IMPLANT
RELOAD STAPLE 45 3.5 BLU ETS (ENDOMECHANICALS) ×1 IMPLANT
RELOAD STAPLE TA45 3.5 REG BLU (ENDOMECHANICALS) ×4 IMPLANT
SCISSORS LAP 5X35 DISP (ENDOMECHANICALS) IMPLANT
SET IRRIG TUBING LAPAROSCOPIC (IRRIGATION / IRRIGATOR) IMPLANT
SET TUBE SMOKE EVAC HIGH FLOW (TUBING) ×2 IMPLANT
SHEARS HARMONIC ACE PLUS 36CM (ENDOMECHANICALS) ×2 IMPLANT
SLEEVE ENDOPATH XCEL 5M (ENDOMECHANICALS) ×2 IMPLANT
SPECIMEN JAR SMALL (MISCELLANEOUS) ×2 IMPLANT
SPONGE GAUZE 2X2 STER 10/PKG (GAUZE/BANDAGES/DRESSINGS) ×1
STRIP CLOSURE SKIN 1/2X4 (GAUZE/BANDAGES/DRESSINGS) ×2 IMPLANT
STRIP CLOSURE SKIN 1/4X4 (GAUZE/BANDAGES/DRESSINGS) ×1 IMPLANT
SUT MNCRL AB 4-0 PS2 18 (SUTURE) ×2 IMPLANT
SYS BAG RETRIEVAL 10MM (BASKET) ×2
SYSTEM BAG RETRIEVAL 10MM (BASKET) IMPLANT
TOWEL GREEN STERILE (TOWEL DISPOSABLE) ×2 IMPLANT
TOWEL GREEN STERILE FF (TOWEL DISPOSABLE) ×2 IMPLANT
TRAY LAPAROSCOPIC MC (CUSTOM PROCEDURE TRAY) ×2 IMPLANT
TROCAR XCEL BLUNT TIP 100MML (ENDOMECHANICALS) ×2 IMPLANT
TROCAR XCEL NON-BLD 5MMX100MML (ENDOMECHANICALS) ×2 IMPLANT
WATER STERILE IRR 1000ML POUR (IV SOLUTION) ×2 IMPLANT

## 2022-02-21 NOTE — ED Provider Notes (Signed)
Emergency Department Provider Note   I have reviewed the triage vital signs and the nursing notes.   HISTORY  Chief Complaint Abdominal Pain   HPI Mark Christian is a 51 y.o. male with past medical history of Addison's disease presents to the emergency department with diffuse abdominal discomfort now localizing in the right lower quadrant.  Patient states he had generalized abdominal discomfort yesterday along with some fatigue.  Over the past 24 hours his pain has now localized in the right lower quadrant.  His only prior surgical history was an inguinal hernia repair, also on the right side.  No fevers or chills.  No dysuria, hesitancy, urgency.  No testicular pain.  No flank or back pain.  Past Medical History:  Diagnosis Date   Addison's disease (HCC)    dx'd in 2004. h/o adrenal crisis in 2004   GERD (gastroesophageal reflux disease)    OTC as needed   Hypothyroidism    Addison's Disease   Right inguinal hernia    Seasonal allergies     Review of Systems  Constitutional: No fever/chills Eyes: No visual changes. ENT: No sore throat. Cardiovascular: Denies chest pain. Respiratory: Denies shortness of breath. Gastrointestinal: Positive RLQ abdominal pain.  No nausea, no vomiting.  No diarrhea.  No constipation. Genitourinary: Negative for dysuria. Musculoskeletal: Negative for back pain. Skin: Negative for rash. Neurological: Negative for headaches, focal weakness or numbness.   ____________________________________________   PHYSICAL EXAM:  VITAL SIGNS: ED Triage Vitals  Enc Vitals Group     BP 02/21/22 0835 (!) 140/96     Pulse Rate 02/21/22 0835 77     Resp 02/21/22 0835 14     Temp 02/21/22 0835 98.6 F (37 C)     Temp src --      SpO2 02/21/22 0835 99 %     Weight 02/21/22 0835 195 lb (88.5 kg)     Height 02/21/22 0835 5\' 11"  (1.803 m)   Constitutional: Alert and oriented. Well appearing and in no acute distress. Eyes: Conjunctivae are normal.   Head: Atraumatic. Nose: No congestion/rhinnorhea. Mouth/Throat: Mucous membranes are moist. Neck: No stridor.   Cardiovascular: Normal rate, regular rhythm. Good peripheral circulation. Grossly normal heart sounds.   Respiratory: Normal respiratory effort.  No retractions. Lungs CTAB. Gastrointestinal: Soft and nontender. No distention.  Musculoskeletal: No gross deformities of extremities. Neurologic:  Normal speech and language.  Skin:  Skin is warm, dry and intact. No rash noted.   ____________________________________________   LABS (all labs ordered are listed, but only abnormal results are displayed)  Labs Reviewed  COMPREHENSIVE METABOLIC PANEL - Abnormal; Notable for the following components:      Result Value   Creatinine, Ser 1.33 (*)    All other components within normal limits  CBC - Abnormal; Notable for the following components:   WBC 10.8 (*)    RBC 6.01 (*)    All other components within normal limits  URINALYSIS, ROUTINE W REFLEX MICROSCOPIC - Abnormal; Notable for the following components:   Protein, ur TRACE (*)    All other components within normal limits  LIPASE, BLOOD   ____________________________________________  RADIOLOGY  CT ABDOMEN PELVIS W CONTRAST  Result Date: 02/21/2022 CLINICAL DATA:  Right lower quadrant pain EXAM: CT ABDOMEN AND PELVIS WITH CONTRAST TECHNIQUE: Multidetector CT imaging of the abdomen and pelvis was performed using the standard protocol following bolus administration of intravenous contrast. RADIATION DOSE REDUCTION: This exam was performed according to the departmental dose-optimization program  which includes automated exposure control, adjustment of the mA and/or kV according to patient size and/or use of iterative reconstruction technique. CONTRAST:  18mL OMNIPAQUE IOHEXOL 300 MG/ML  SOLN COMPARISON:  2019 FINDINGS: Lower chest: No acute abnormality. Hepatobiliary: No focal liver abnormality is seen. No gallstones, gallbladder  wall thickening, or biliary dilatation. Pancreas: Unremarkable. No pancreatic ductal dilatation or surrounding inflammatory changes. Spleen: Unremarkable. Adrenals/Urinary Tract: Adrenals are unremarkable. 2 mm nonobstructing calculus of the right upper pole. Bladder is poorly distended but appears unremarkable. Stomach/Bowel: Stomach is within normal limits. Bowel is normal in caliber. The appendix is partially dilated measuring up to 1.1 cm. Surrounding inflammatory changes are present. Sigmoid diverticulosis. Vascular/Lymphatic: No significant vascular abnormality. No enlarged nodes. Reproductive: Unremarkable. Other: No free air or abscess.  Abdominal wall is unremarkable. Musculoskeletal: No acute osseous abnormality. IMPRESSION: Acute appendicitis without complication. Nonemergent findings include small right renal calculus and sigmoid diverticulosis. These results were called by telephone at the time of interpretation on 02/21/2022 at 9:50 am to provider Shalandria Elsbernd , who verbally acknowledged these results. Electronically Signed   By: Guadlupe Spanish M.D.   On: 02/21/2022 09:55    ____________________________________________   PROCEDURES  Procedure(s) performed:   Procedures  CRITICAL CARE Performed by: Maia Plan Total critical care time: 35 minutes Critical care time was exclusive of separately billable procedures and treating other patients. Critical care was necessary to treat or prevent imminent or life-threatening deterioration. Critical care was time spent personally by me on the following activities: development of treatment plan with patient and/or surrogate as well as nursing, discussions with consultants, evaluation of patient's response to treatment, examination of patient, obtaining history from patient or surrogate, ordering and performing treatments and interventions, ordering and review of laboratory studies, ordering and review of radiographic studies, pulse oximetry and  re-evaluation of patient's condition.  Alona Bene, MD Emergency Medicine  ____________________________________________   INITIAL IMPRESSION / ASSESSMENT AND PLAN / ED COURSE  Pertinent labs & imaging results that were available during my care of the patient were reviewed by me and considered in my medical decision making (see chart for details).   This patient is Presenting for Evaluation of abdominal pain, which does require a range of treatment options, and is a complaint that involves a high risk of morbidity and mortality.  The Differential Diagnoses includes but is not exclusive to acute appendicitis, renal colic, testicular torsion, urinary tract infection, prostatitis,  diverticulitis, small bowel obstruction, colitis, abdominal aortic aneurysm, gastroenteritis, constipation etc.   Critical Interventions-    Medications  cefTRIAXone (ROCEPHIN) 2 g in sodium chloride 0.9 % 100 mL IVPB (2 g Intravenous New Bag/Given 02/21/22 1021)    And  metroNIDAZOLE (FLAGYL) IVPB 500 mg (has no administration in time range)  ondansetron (ZOFRAN) injection 4 mg (has no administration in time range)  lactated ringers infusion (has no administration in time range)  sodium chloride 0.9 % bolus 500 mL (500 mLs Intravenous New Bag/Given 02/21/22 0953)  iohexol (OMNIPAQUE) 300 MG/ML solution 100 mL (85 mLs Intravenous Contrast Given 02/21/22 0937)    Reassessment after intervention: Pain not worsening.    I did obtain Additional Historical Information from wife at bedside.  I decided to review pertinent External Data, and in summary patient with history of Addison's disease.   Clinical Laboratory Tests Ordered, included mild leukocytosis to 10.8.  Creatinine of 1.3 with normal BUN and normal electrolytes.  No UTI.  Radiologic Tests Ordered, included CT abdomen/pelvis. I independently interpreted the  images and agree with radiology interpretation.   Cardiac Monitor Tracing which shows  NSR   Social Determinants of Health Risk patient is a non-smoker.   Consult complete with General Surgery. They would like the patient transferred to pre-op for appendectomy.   Medical Decision Making: Summary:  Patient presents emergency department with a right lower quadrant abdominal pain.  He has focal tenderness in the right lower quadrant without rebound.  History is concerning for acute appendicitis.  No testicular pain or flank discomfort to suspect GU etiology.   Reevaluation with update and discussion with patient and wife.  CT abdomen pelvis consistent with uncomplicated no acute appendicitis.  I have paged general surgery for discussion and will start antibiotics.   Disposition: admit  ____________________________________________  FINAL CLINICAL IMPRESSION(S) / ED DIAGNOSES  Final diagnoses:  Acute appendicitis with localized peritonitis, without perforation, abscess, or gangrene     Note:  This document was prepared using Dragon voice recognition software and may include unintentional dictation errors.  Alona Bene, MD, [facility] Emergency Medicine    Del Wiseman, Arlyss Repress, MD 02/21/22 1159

## 2022-02-21 NOTE — ED Triage Notes (Signed)
Patient reports to the ER for abdominal pain that started yesterday and has since moved to the RLQ. Patient reports some mild nausea. Denies emesis or diarrhea.

## 2022-02-21 NOTE — Transfer of Care (Signed)
Immediate Anesthesia Transfer of Care Note  Patient: Mark Christian  Procedure(s) Performed: APPENDECTOMY LAPAROSCOPIC (Abdomen)  Patient Location: PACU  Anesthesia Type:General  Level of Consciousness: awake, alert , oriented, patient cooperative and responds to stimulation  Airway & Oxygen Therapy: Patient Spontanous Breathing  Post-op Assessment: Report given to RN and Post -op Vital signs reviewed and stable  Post vital signs: Reviewed and stable  Last Vitals:  Vitals Value Taken Time  BP 104/63 02/21/22 1430  Temp 36.5 C 02/21/22 1426  Pulse 86 02/21/22 1431  Resp 12 02/21/22 1431  SpO2 93 % 02/21/22 1431  Vitals shown include unvalidated device data.  Last Pain:  Vitals:   02/21/22 1256  TempSrc: Oral  PainSc: 5          Complications: No notable events documented.

## 2022-02-21 NOTE — Discharge Instructions (Signed)
[facility] ° °Please arrive at least 30 min before your appointment to complete your check in paperwork.  If you are unable to arrive 30 min prior to your appointment time we may have to cancel or reschedule you. °LAPAROSCOPIC SURGERY: POST OP INSTRUCTIONS °Always review your discharge instruction sheet given to you by the facility where your surgery was performed. °IF YOU HAVE DISABILITY OR FAMILY LEAVE FORMS, YOU MUST BRING THEM TO THE OFFICE FOR PROCESSING.   °DO NOT GIVE THEM TO YOUR DOCTOR. ° °PAIN CONTROL ° °First take acetaminophen (Tylenol) AND/or ibuprofen (Advil) to control your pain after surgery.  Follow directions on package.  Taking acetaminophen (Tylenol) and/or ibuprofen (Advil) regularly after surgery will help to control your pain and lower the amount of prescription pain medication you may need.  You should not take more than 4,000 mg (4 grams) of acetaminophen (Tylenol) in 24 hours.  You should not take ibuprofen (Advil), aleve, motrin, naprosyn or other NSAIDS if you have a history of stomach ulcers or chronic kidney disease.  °A prescription for pain medication may be given to you upon discharge.  Take your pain medication as prescribed, if you still have uncontrolled pain after taking acetaminophen (Tylenol) or ibuprofen (Advil). °Use ice packs to help control pain. °If you need a refill on your pain medication, please contact your pharmacy.  They will contact our office to request authorization. Prescriptions will not be filled after 5pm or on week-ends. ° °HOME MEDICATIONS °Take your usually prescribed medications unless otherwise directed. ° °DIET °You should follow a light diet the first few days after arrival home.  Be sure to include lots of fluids daily. Avoid fatty, fried foods.  ° °CONSTIPATION °It is common to experience some constipation after surgery and if you are taking pain medication.  Increasing fluid intake and taking a stool softener (such as Colace)  will usually help or prevent this problem from occurring.  A mild laxative (Milk of Magnesia or Miralax) should be taken according to package instructions if there are no bowel movements after 48 hours. ° °WOUND/INCISION CARE °Most patients will experience some swelling and bruising in the area of the incisions.  Ice packs will help.  Swelling and bruising can take several days to resolve.  °Unless discharge instructions indicate otherwise, follow guidelines below  °STERI-STRIPS - you may remove your outer bandages 48 hours after surgery, and you may shower at that time.  You have steri-strips (small skin tapes) in place directly over the incision.  These strips should be left on the skin for 7-10 days.   °DERMABOND/SKIN GLUE - you may shower in 24 hours.  The glue will flake off over the next 2-3 weeks. °Any sutures or staples will be removed at the office during your follow-up visit. ° °ACTIVITIES °You may resume regular (light) daily activities beginning the next day--such as daily self-care, walking, climbing stairs--gradually increasing activities as tolerated.  You may have sexual intercourse when it is comfortable.  Refrain from any heavy lifting or straining until approved by your doctor. °You may drive when you are no longer taking prescription pain medication, you can comfortably wear a seatbelt, and you can safely maneuver your car and apply brakes. ° °FOLLOW-UP °You should see your doctor in the office for a follow-up appointment approximately 2-3 weeks after your surgery.  You should have been given your post-op/follow-up appointment when your surgery was scheduled.  If you did not receive a post-op/follow-up appointment, make sure   that you call for this appointment within a day or two after you arrive home to insure a convenient appointment time. ° ° °WHEN TO CALL YOUR DOCTOR: °Fever over 101.0 °Inability to urinate °Continued bleeding from incision. °Increased pain, redness, or drainage from the  incision. °Increasing abdominal pain ° °The clinic staff is available to answer your questions during regular business hours.  Please don’t hesitate to call and ask to speak to one of the nurses for clinical concerns.  If you have a medical emergency, go to the nearest emergency room or call 911.  A surgeon from [facility] is always on call at the hospital. °[address], [address], [place], [place]  [address] ? [address] [address], [place], [place]   [address] °[phone] ? [phone] ? FAX [phone] ° ° ° ° °Managing Your Pain After Surgery Without Opioids ° ° ° °Thank you for participating in our program to help patients manage their pain after surgery without opioids. This is part of our effort to provide you with the best care possible, without exposing you or your family to the risk that opioids pose. ° °What pain can I expect after surgery? °You can expect to have some pain after surgery. This is normal. The pain is typically worse the day after surgery, and quickly begins to get better. °Many studies have found that many patients are able to manage their pain after surgery with Over-the-Counter (OTC) medications such as Tylenol and Motrin. If you have a condition that does not allow you to take Tylenol or Motrin, notify your surgical team. ° °How will I manage my pain? °The best strategy for controlling your pain after surgery is around the clock pain control with Tylenol (acetaminophen) and Motrin (ibuprofen or Advil). Alternating these medications with each other allows you to maximize your pain control. In addition to Tylenol and Motrin, you can use heating pads or ice packs on your incisions to help reduce your pain. ° °How will I alternate your regular strength over-the-counter pain medication? °You will take a dose of pain medication every three hours. °Start by taking 650 mg of Tylenol (2 pills of 325 mg) °3 hours later take 600 mg of Motrin (3 pills of 200 mg) °3 hours after  taking the Motrin take 650 mg of Tylenol °3 hours after that take 600 mg of Motrin. ° ° °- 1 - ° °See example - if your first dose of Tylenol is at 12:00 PM ° ° °12:00 PM Tylenol 650 mg (2 pills of 325 mg)  °3:00 PM Motrin 600 mg (3 pills of 200 mg)  °6:00 PM Tylenol 650 mg (2 pills of 325 mg)  °9:00 PM Motrin 600 mg (3 pills of 200 mg)  °Continue alternating every 3 hours  ° °We recommend that you follow this schedule around-the-clock for at least 3 days after surgery, or until you feel that it is no longer needed. Use the table on the last page of this handout to keep track of the medications you are taking. °Important: °Do not take more than 3000mg of Tylenol or 3200mg of Motrin in a 24-hour period. °Do not take ibuprofen/Motrin if you have a history of bleeding stomach ulcers, severe kidney disease, &/or actively taking a blood thinner ° °What if I still have pain? °If you have pain that is not controlled with the over-the-counter pain medications (Tylenol and Motrin or Advil) you might have what we call “breakthrough” pain. You will receive a prescription   for a small amount of an opioid pain medication such as Oxycodone, Tramadol, or Tylenol with Codeine. Use these opioid pills in the first 24 hours after surgery if you have breakthrough pain. Do not take more than 1 pill every 4-6 hours. ° °If you still have uncontrolled pain after using all opioid pills, don't hesitate to call our staff using the number provided. We will help make sure you are managing your pain in the best way possible, and if necessary, we can provide a prescription for additional pain medication. ° ° °Day 1   ° °Time  °Name of Medication Number of pills taken  °Amount of Acetaminophen  °Pain Level  ° °Comments  °AM PM       °AM PM       °AM PM       °AM PM       °AM PM       °AM PM       °AM PM       °AM PM       °Total Daily amount of Acetaminophen °Do not take more than  3,000 mg per day    ° ° °Day 2   ° °Time  °Name of Medication  Number of pills °taken  °Amount of Acetaminophen  °Pain Level  ° °Comments  °AM PM       °AM PM       °AM PM       °AM PM       °AM PM       °AM PM       °AM PM       °AM PM       °Total Daily amount of Acetaminophen °Do not take more than  3,000 mg per day    ° ° °Day 3   ° °Time  °Name of Medication Number of pills taken  °Amount of Acetaminophen  °Pain Level  ° °Comments  °AM PM       °AM PM       °AM PM       °AM PM       ° ° ° °AM PM       °AM PM       °AM PM       °AM PM       °Total Daily amount of Acetaminophen °Do not take more than  3,000 mg per day    ° ° °Day 4   ° °Time  °Name of Medication Number of pills taken  °Amount of Acetaminophen  °Pain Level  ° °Comments  °AM PM       °AM PM       °AM PM       °AM PM       °AM PM       °AM PM       °AM PM       °AM PM       °Total Daily amount of Acetaminophen °Do not take more than  3,000 mg per day    ° ° °Day 5   ° °Time  °Name of Medication Number °of pills taken  °Amount of Acetaminophen  °Pain Level  ° °Comments  °AM PM       °AM PM       °AM PM       °AM PM       °AM PM       °AM   PM       °AM PM       °AM PM       °Total Daily amount of Acetaminophen °Do not take more than  3,000 mg per day    ° ° ° °Day 6   ° °Time  °Name of Medication Number of pills °taken  °Amount of Acetaminophen  °Pain Level  °Comments  °AM PM       °AM PM       °AM PM       °AM PM       °AM PM       °AM PM       °AM PM       °AM PM       °Total Daily amount of Acetaminophen °Do not take more than  3,000 mg per day    ° ° °Day 7   ° °Time  °Name of Medication Number of pills taken  °Amount of Acetaminophen  °Pain Level  ° °Comments  °AM PM       °AM PM       °AM PM       °AM PM       °AM PM       °AM PM       °AM PM       °AM PM       °Total Daily amount of Acetaminophen °Do not take more than  3,000 mg per day    ° ° ° ° °For additional information about how and where to safely dispose of unused opioid °medications - https://www.morepowerfulnc.org ° °Disclaimer: This document  contains information and/or instructional materials adapted from [facility] for the typical patient with your condition. It does not replace medical advice from your health care provider because your experience may differ from that of the °typical patient. Talk to your health care provider if you have any questions about this °document, your condition or your treatment plan. °Adapted from [facility] ° °

## 2022-02-21 NOTE — ED Notes (Signed)
General surgery paged at 10:13am

## 2022-02-21 NOTE — Anesthesia Postprocedure Evaluation (Deleted)
Anesthesia Post Note  Patient: Mark Christian  Procedure(s) Performed: APPENDECTOMY LAPAROSCOPIC (Abdomen)     Patient location during evaluation: PACU Anesthesia Type: General Level of consciousness: sedated and patient cooperative Pain management: pain level controlled Vital Signs Assessment: post-procedure vital signs reviewed and stable Respiratory status: spontaneous breathing Cardiovascular status: stable Anesthetic complications: no   No notable events documented.  Last Vitals:  Vitals:   02/21/22 1426 02/21/22 1430  BP:  104/63  Pulse:  83  Resp:  17  Temp: 36.5 C   SpO2:  93%    Last Pain:  Vitals:   02/21/22 1256  TempSrc: Oral  PainSc: 5                  Lewie Loron

## 2022-02-21 NOTE — Op Note (Signed)
Appendectomy, Lap, Procedure Note  Indications: The patient presented with a history of right-sided abdominal pain. A CT scan revealed findings consistent with acute appendicitis.  Pre-operative Diagnosis: Acute appendicitis without mention of peritonitis  Post-operative Diagnosis: Same  Surgeon: Wynona Luna   Assistants: none  Anesthesia: General endotracheal anesthesia  ASA Class: 2E  Procedure Details  The patient was seen again in the Holding Room. The risks, benefits, complications, treatment options, and expected outcomes were discussed with the patient and/or family. The possibilities of reaction to medication, perforation of viscus, bleeding, recurrent infection, finding a normal appendix, the need for additional procedures, failure to diagnose a condition, and creating a complication requiring transfusion or operation were discussed. There was concurrence with the proposed plan and informed consent was obtained. The site of surgery was properly noted. The patient was taken to Operating Room, identified as [name] and the procedure verified as Appendectomy. A Time Out was held and the above information confirmed.  The patient was placed in the supine position and general anesthesia was induced.  The abdomen was prepped and draped in a sterile fashion. A one centimeter infraumbilical incision was made.  Dissection was carried down to the fascia bluntly.  The fascia was incised vertically.  We entered the peritoneal cavity bluntly.  A pursestring suture was passed around the fascial opening with a 0 Vicryl.  The Hasson cannula was introduced into the abdomen and the tails of the suture were used to hold the Hasson in place.   The pneumoperitoneum was then established maintaining a maximum pressure of 15 mmHg.  Additional 5 mm cannulas then placed in the left lower quadrant of the abdomen and the epigastrium under direct visualization. A careful evaluation of the entire abdomen  was carried out. The patient was placed in Trendelenburg and left lateral decubitus position.  The scope was moved to the right upper quadrant port site. The cecum was mobilized medially.  The appendix was identified.  There was some thickening and erythema in the proximal appendix, but there was no sign of perforation.  The appendix was carefully dissected. The appendix was skeletonized with the harmonic scalpel.   The appendix was divided at its base using an endo-GIA stapler. Minimal appendiceal stump was left in place. There was no evidence of bleeding, leakage, or complication after division of the appendix. Irrigation was also performed and irrigate suctioned from the abdomen as well.  The umbilical port site was closed with the purse string suture. There was no residual palpable fascial defect.  The trocar site skin wounds were closed with 4-0 Monocryl.  Instrument, sponge, and needle counts were correct at the conclusion of the case.   Findings: The appendix was found to be inflamed. There were not signs of necrosis.  There was not perforation. There was not abscess formation.  Estimated Blood Loss:  Minimal         Drains: none         Specimens: appendix         Complications:  None; patient tolerated the procedure well.         Disposition: PACU - hemodynamically stable.         Condition: stable  Momodou Consiglio. Corliss Skains, MD, [facility] Surgery  General Surgery   02/21/2022 2:19 PM

## 2022-02-21 NOTE — H&P (Addendum)
Mark Christian [birth year]  [id number].    Chief Complaint/Reason for Consult: acute appendicitis  HPI:  This is a 51 yo white male with a history of Addison's disease  and hypothyroidism who began having diffuse abdominal pain about 24 hours ago.  This has now localized to the RLQ.  He denies any fevers, chills, chest pain, shortness of breath, nausea or vomiting.  Due to persistent pain, he presented to [facility] for evaluation.  His WBC is normal as are his other labs except his cr is slightly elevated at 1.33 but GFR is still >60.  His CT scan shows acute appendicitis and nonemergent findings of a right renal calculus and sigmoid diverticulosis.  We have been asked to see him for evaluation.  ROS: ROS: please see HPI  Family History  Problem Relation Age of Onset   Hypertension Mother    Hypertension Father    Parkinson's disease Maternal Grandfather    Dementia Paternal Grandmother    Colon cancer Neg Hx     Past Medical History:  Diagnosis Date   Addison's disease (HCC)    dx'd in 2004. h/o adrenal crisis in 2004   GERD (gastroesophageal reflux disease)    OTC as needed   Hypothyroidism    Addison's Disease   Right inguinal hernia    Seasonal allergies     Past Surgical History:  Procedure Laterality Date   INGUINAL HERNIA REPAIR Right 12/07/2017   Procedure: RIGHT HERNIA REPAIR INGUINAL ADULT WITH MESH;  Surgeon: Emelia Loron, MD;  Location: [facility];  Service: General;  Laterality: Right;  GENERAL WITH TAP BLOCK   INSERTION OF MESH Right 12/07/2017   Procedure: INSERTION OF MESH;  Surgeon: Emelia Loron, MD;  Location: [facility];  Service: General;  Laterality: Right;  GENERAL WITH TAP BLOCK   RHINOPLASTY  1970s     Social History:  reports that he has never smoked. He has never used smokeless tobacco. He reports current alcohol use. He reports that he does not use drugs.  Allergies:  Allergies  Allergen Reactions    Amoxicillin-Pot Clavulanate     Stomach upset    (Not in a hospital admission)    Physical Exam: Blood pressure 133/85, pulse 94, temperature 98.6 F (37 C), resp. rate 18, height 5\' 11"  (1.803 m), weight 88.5 kg, SpO2 98 %. General: pleasant, WD, WN white male who is laying in bed in NAD HEENT: head is normocephalic, atraumatic.  Sclera are noninjected.  PERRL.  Ears and nose without any masses or lesions.  Mouth is pink and moist Heart: regular, rate, and rhythm.  Normal s1,s2. No obvious murmurs, gallops, or rubs noted.  Palpable radial and pedal pulses bilaterally Lungs: CTAB, no wheezes, rhonchi, or rales noted.  Respiratory effort nonlabored Abd: soft, tender in RLQ at McBurney's point, ND, +BS, no masses, hernias, or organomegaly Psych: A&Ox3 with an appropriate affect.   Results for orders placed or performed during the hospital encounter of 02/21/22 (from the past 48 hour(s))  Lipase, blood     Status: None   Collection Time: 02/21/22  8:44 AM  Result Value Ref Range   Lipase 38 11 - 51 U/L    Comment: Performed at 02/23/22, [address] [place], [place]  Comprehensive metabolic panel     Status: Abnormal   Collection Time: 02/21/22  8:44 AM  Result Value Ref Range   Sodium 140 135 - 145 mmol/L  Potassium 4.0 3.5 - 5.1 mmol/L   Chloride 100 98 - 111 mmol/L   CO2 31 22 - 32 mmol/L   Glucose, Bld 98 70 - 99 mg/dL    Comment: Glucose reference range applies only to samples taken after fasting for at least 8 hours.   BUN 17 6 - 20 mg/dL   Creatinine, Ser 7.06 (H) 0.61 - 1.24 mg/dL   Calcium 23.7 8.9 - 62.8 mg/dL   Total Protein 7.1 6.5 - 8.1 g/dL   Albumin 4.6 3.5 - 5.0 g/dL   AST 15 15 - 41 U/L   ALT 17 0 - 44 U/L   Alkaline Phosphatase 59 38 - 126 U/L   Total Bilirubin 0.8 0.3 - 1.2 mg/dL   GFR, Estimated >31 >51 mL/min    Comment: (NOTE) Calculated using the CKD-EPI Creatinine Equation (2021)    Anion gap 9 5 - 15     Comment: Performed at [facility], [address] [place], [place] [address]  CBC     Status: Abnormal   Collection Time: 02/21/22  8:44 AM  Result Value Ref Range   WBC 10.8 (H) 4.0 - 10.5 K/uL   RBC 6.01 (H) 4.22 - 5.81 MIL/uL   Hemoglobin 15.6 13.0 - 17.0 g/dL   HCT 73.7 10.6 - 26.9 %   MCV 80.2 80.0 - 100.0 fL   MCH 26.0 26.0 - 34.0 pg   MCHC 32.4 30.0 - 36.0 g/dL   RDW 48.5 46.2 - 70.3 %   Platelets 305 150 - 400 K/uL   nRBC 0.0 0.0 - 0.2 %    Comment: Performed at [facility], [address] [place], [place] [address]  Urinalysis, Routine w reflex microscopic Urine, Clean Catch     Status: Abnormal   Collection Time: 02/21/22  8:44 AM  Result Value Ref Range   Color, Urine YELLOW YELLOW   APPearance CLEAR CLEAR   Specific Gravity, Urine 1.026 1.005 - 1.030   pH 6.5 5.0 - 8.0   Glucose, UA NEGATIVE NEGATIVE mg/dL   Hgb urine dipstick NEGATIVE NEGATIVE   Bilirubin Urine NEGATIVE NEGATIVE   Ketones, ur NEGATIVE NEGATIVE mg/dL   Protein, ur TRACE (A) NEGATIVE mg/dL   Nitrite NEGATIVE NEGATIVE   Leukocytes,Ua NEGATIVE NEGATIVE    Comment: Performed at [facility], [address], [place], [place] [address]   CT ABDOMEN PELVIS W CONTRAST  Result Date: 02/21/2022 CLINICAL DATA:  Right lower quadrant pain EXAM: CT ABDOMEN AND PELVIS WITH CONTRAST TECHNIQUE: Multidetector CT imaging of the abdomen and pelvis was performed using the standard protocol following bolus administration of intravenous contrast. RADIATION DOSE REDUCTION: This exam was performed according to the departmental dose-optimization program which includes automated exposure control, adjustment of the mA and/or kV according to patient size and/or use of iterative reconstruction technique. CONTRAST:  33mL OMNIPAQUE IOHEXOL 300 MG/ML  SOLN COMPARISON:  2019 FINDINGS: Lower chest: No acute abnormality. Hepatobiliary: No focal liver abnormality is seen. No  gallstones, gallbladder wall thickening, or biliary dilatation. Pancreas: Unremarkable. No pancreatic ductal dilatation or surrounding inflammatory changes. Spleen: Unremarkable. Adrenals/Urinary Tract: Adrenals are unremarkable. 2 mm nonobstructing calculus of the right upper pole. Bladder is poorly distended but appears unremarkable. Stomach/Bowel: Stomach is within normal limits. Bowel is normal in caliber. The appendix is partially dilated measuring up to 1.1 cm. Surrounding inflammatory changes are present. Sigmoid diverticulosis. Vascular/Lymphatic: No significant vascular abnormality. No enlarged nodes. Reproductive: Unremarkable. Other: No free air or abscess.  Abdominal  wall is unremarkable. Musculoskeletal: No acute osseous abnormality. IMPRESSION: Acute appendicitis without complication. Nonemergent findings include small right renal calculus and sigmoid diverticulosis. These results were called by telephone at the time of interpretation on 02/21/2022 at 9:50 am to provider JOSHUA LONG , who verbally acknowledged these results. Electronically Signed   By: Guadlupe Spanish M.D.   On: 02/21/2022 09:55      Assessment/Plan Acute appendicitis The patient has been seen, examined, chart, labs, vitals, and CT scan personally reviewed.  He appears to have acute uncomplicated appendicitis.  We will plan to proceed with OR today for lap appy and if all goes well, likely plan for DC home from PACU.  I have discussed the procedure and risks of appendectomy. The risks include but are not limited to bleeding, infection, wound problems, anesthesia, injury to intra-abdominal organs, possibility of postoperative ileus. He seems to understand and agrees with the plan.   FEN - NPO VTE - [facility]CDs ID - Rocephin/Flagyl Admit - likely DC from PACU  Addison's disease - stress dose steroid pre-op  I reviewed ED provider notes, last 24 h vitals and pain scores, last 48 h intake and output, last 24 h labs and trends, and  last 24 h imaging results.  Letha Cape, [facility] Surgery 02/21/2022, 12:16 PM Please see Amion for pager number during day hours 7:00am-4:30pm or 7:00am -11:30am on weekends

## 2022-02-21 NOTE — Anesthesia Preprocedure Evaluation (Addendum)
Anesthesia Evaluation  Patient identified by MRN, date of birth, ID band Patient awake    Reviewed: Allergy & Precautions, NPO status , Patient's Chart, lab work & pertinent test results  Airway Mallampati: II  TM Distance: >3 FB Neck ROM: Full    Dental  (+) Dental Advisory Given, Teeth Intact   Pulmonary neg pulmonary ROS,    Pulmonary exam normal breath sounds clear to auscultation       Cardiovascular negative cardio ROS Normal cardiovascular exam Rhythm:Regular Rate:Normal     Neuro/Psych PSYCHIATRIC DISORDERS Anxiety negative neurological ROS     GI/Hepatic Neg liver ROS, GERD  ,  Endo/Other  Hypothyroidism   Renal/GU negative Renal ROS     Musculoskeletal negative musculoskeletal ROS (+)   Abdominal   Peds  Hematology negative hematology ROS (+)   Anesthesia Other Findings   Reproductive/Obstetrics                            Anesthesia Physical  Anesthesia Plan  ASA: 2 and emergent  Anesthesia Plan: General   Post-op Pain Management: GA combined w/ Regional for post-op pain and Ofirmev IV (intra-op)* and Toradol IV (intra-op)*   Induction: Intravenous, Rapid sequence and Cricoid pressure planned  PONV Risk Score and Plan: 4 or greater and Ondansetron, Dexamethasone and Midazolam  Airway Management Planned: Oral ETT  Additional Equipment: None  Intra-op Plan:   Post-operative Plan: Extubation in OR  Informed Consent: I have reviewed the patients History and Physical, chart, labs and discussed the procedure including the risks, benefits and alternatives for the proposed anesthesia with the patient or authorized representative who has indicated his/her understanding and acceptance.     Dental advisory given  Plan Discussed with: CRNA  Anesthesia Plan Comments: (Plan stress dose hydrocortisone, 50mg  hydrocortisone)       Anesthesia Quick Evaluation

## 2022-02-22 ENCOUNTER — Inpatient Hospital Stay: Admit: 2022-02-22 | Payer: 59 | Admitting: Surgery

## 2022-02-22 ENCOUNTER — Encounter (HOSPITAL_COMMUNITY): Payer: Self-pay | Admitting: Surgery

## 2022-02-22 LAB — SURGICAL PATHOLOGY

## 2022-02-22 SURGERY — APPENDECTOMY, LAPAROSCOPIC
Anesthesia: General

## 2022-02-22 NOTE — Anesthesia Postprocedure Evaluation (Signed)
Anesthesia Post Note  Patient: Mark Christian  Procedure(s) Performed: APPENDECTOMY LAPAROSCOPIC (Abdomen)     Patient location during evaluation: PACU Anesthesia Type: General Level of consciousness: sedated and patient cooperative Pain management: pain level controlled Vital Signs Assessment: post-procedure vital signs reviewed and stable Respiratory status: spontaneous breathing Cardiovascular status: stable Anesthetic complications: no   No notable events documented.  Last Vitals:  Vitals:   02/21/22 1445 02/21/22 1456  BP: 120/87 123/83  Pulse: 90 69  Resp: 13 15  Temp:  (!) 36.4 C  SpO2: 94% 94%    Last Pain:  Vitals:   02/21/22 1456  TempSrc:   PainSc: Asleep                 Lewie Loron

## 2022-03-12 IMAGING — US US RENAL
1 series · 14 of 25 positions shown · non-contrast
Comparison: None.

CLINICAL DATA: Kidney stone

EXAM:
RENAL / URINARY TRACT ULTRASOUND COMPLETE

[Series 1: us renal · 0.23mm/px · 14 of 41 slices shown]
[im 1/41]
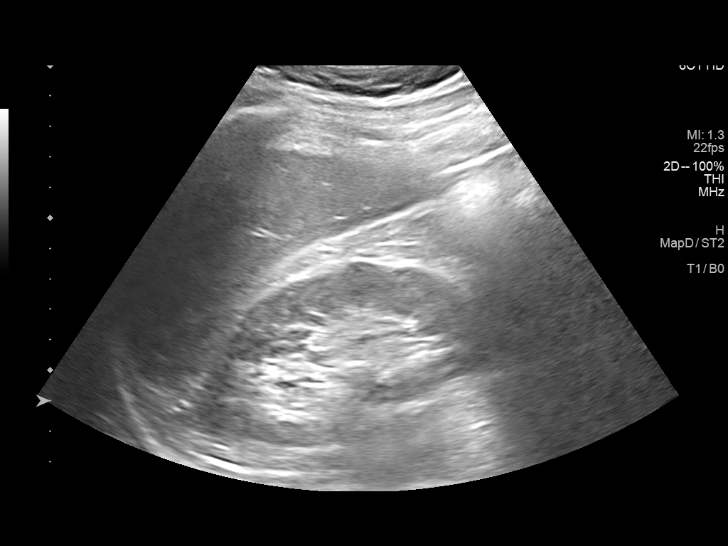
[im 4/41]
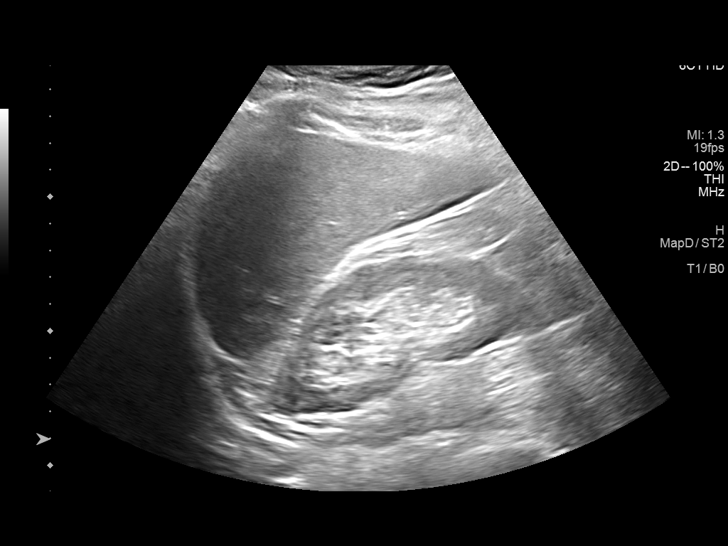
[im 7/41]
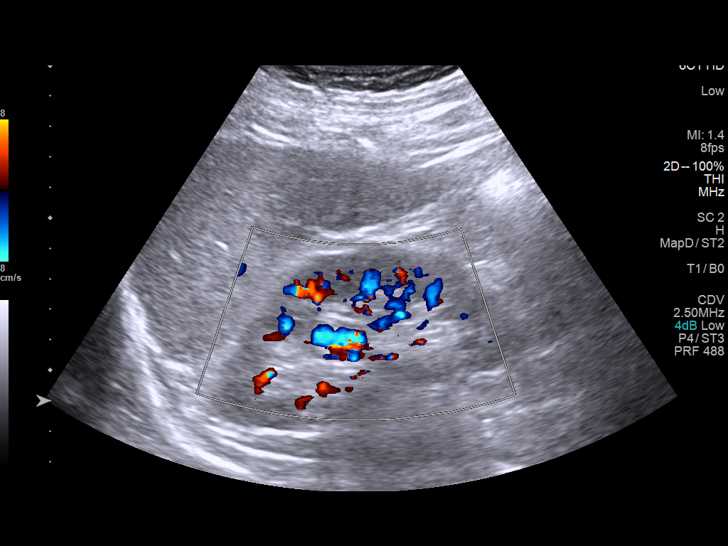
[im 11/41]
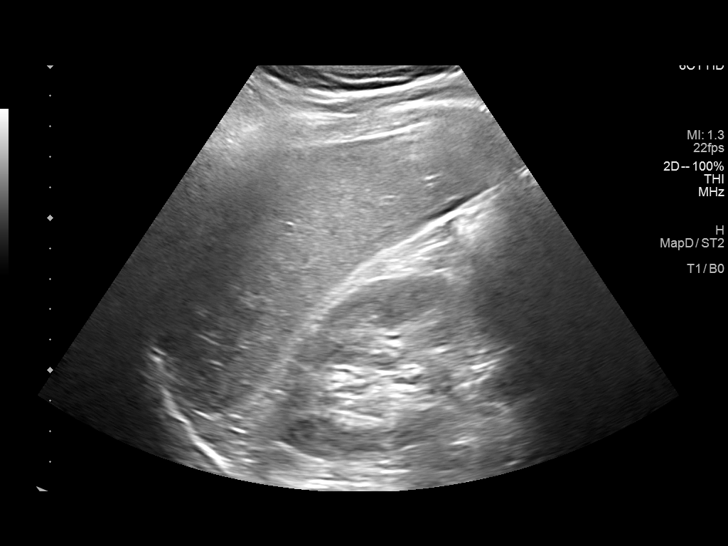
[im 14/41]
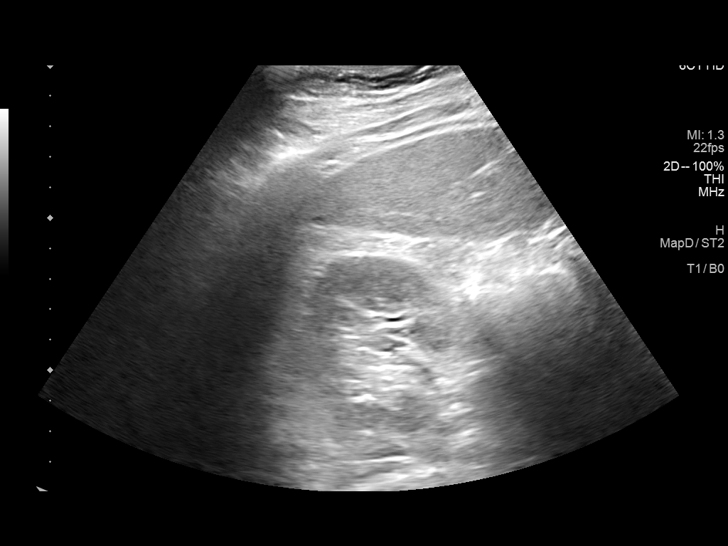
[im 16/41]
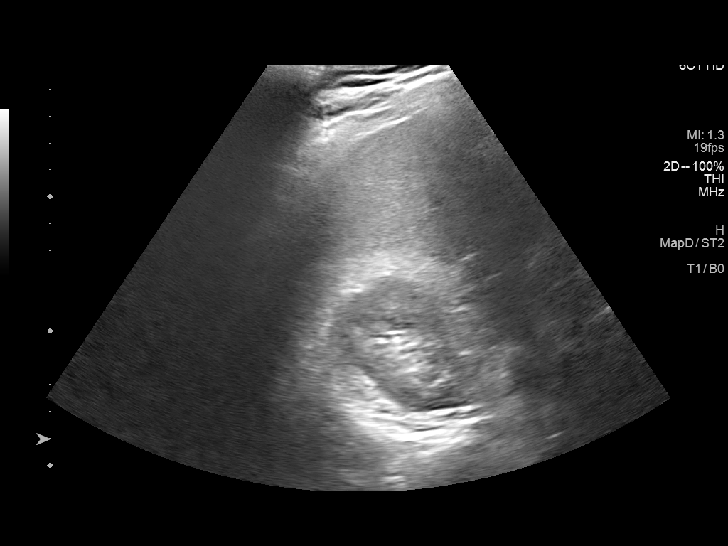
[im 19/41]
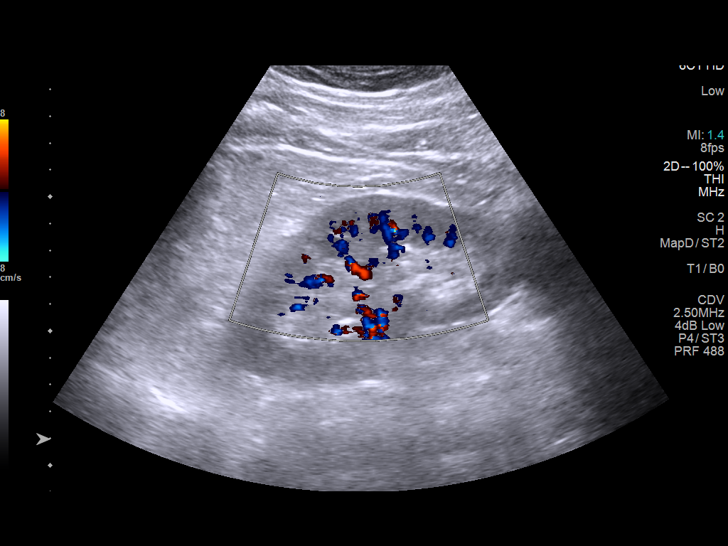
[im 22/41]
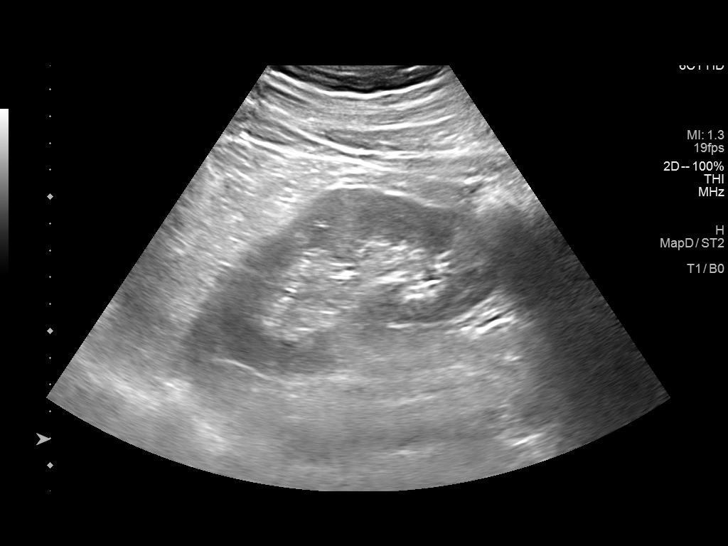
[im 26/41]
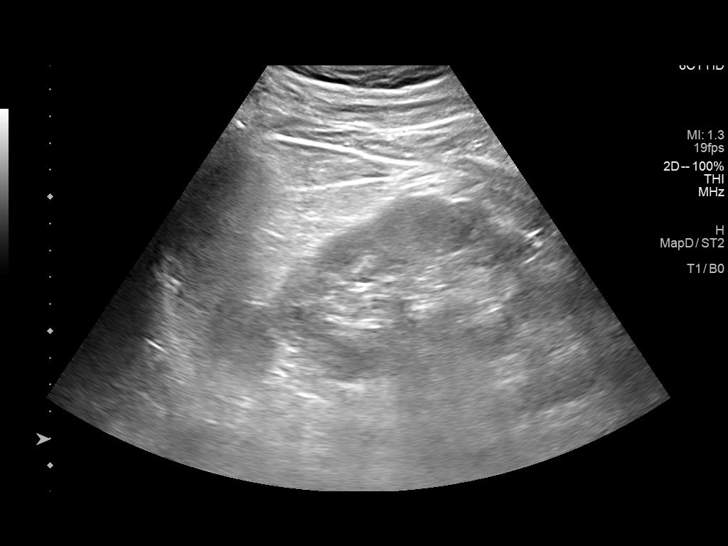
[im 27/41]
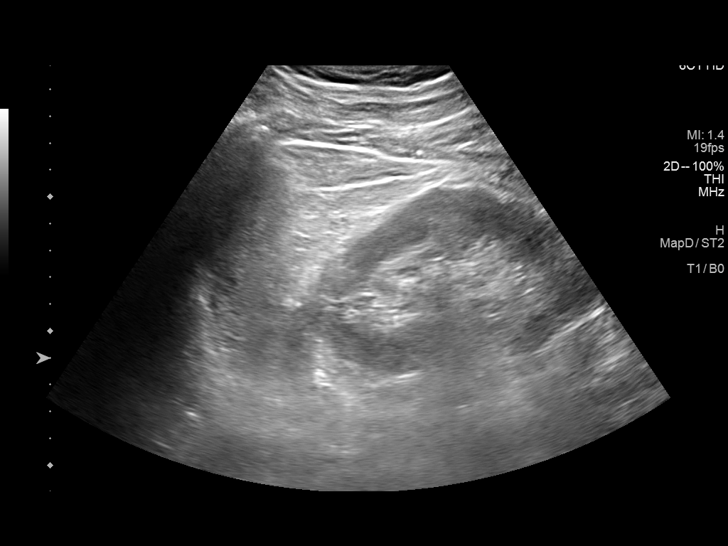
[im 31/41]
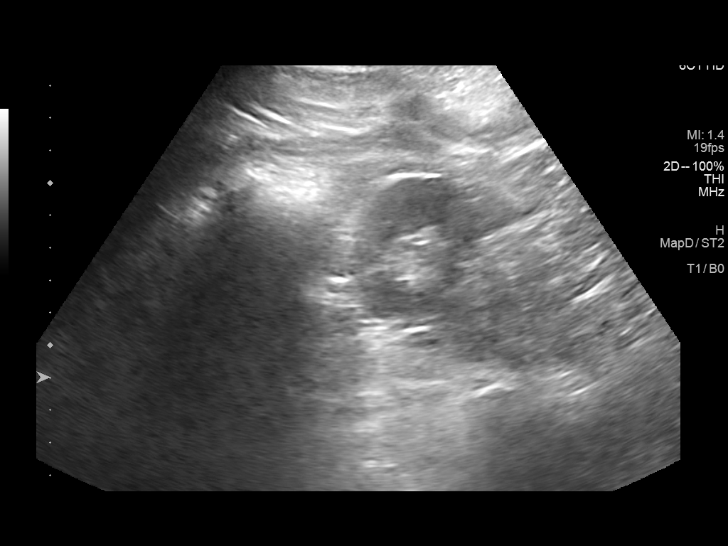
[im 34/41]
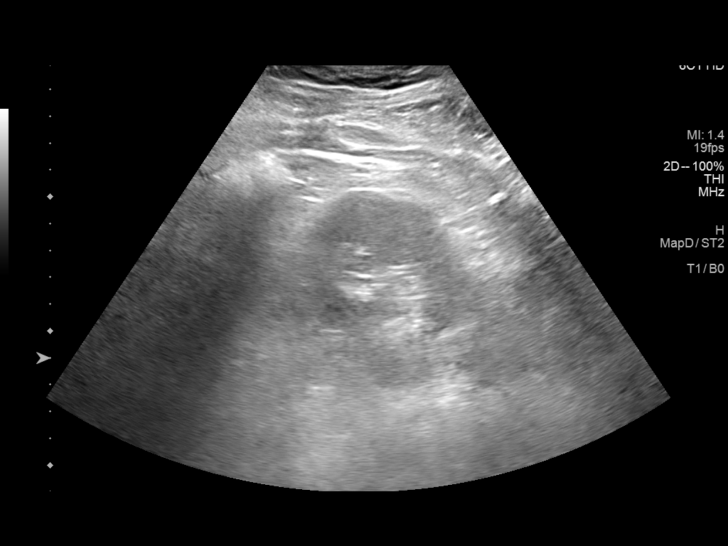
[im 37/41]
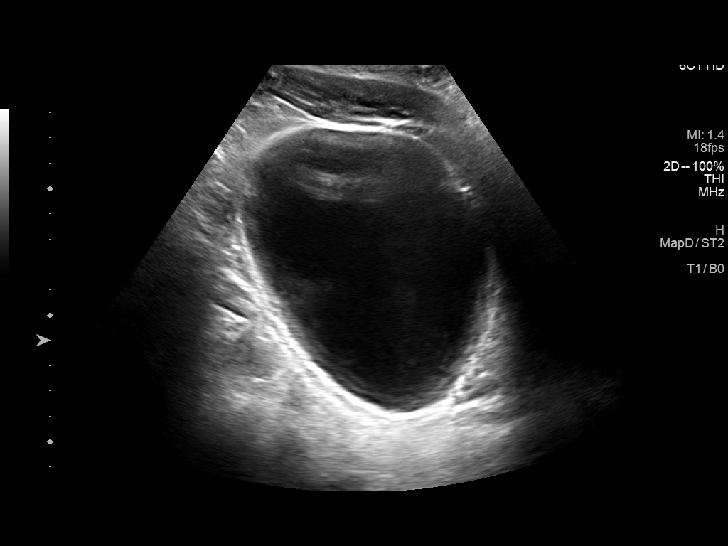
[im 41/41]
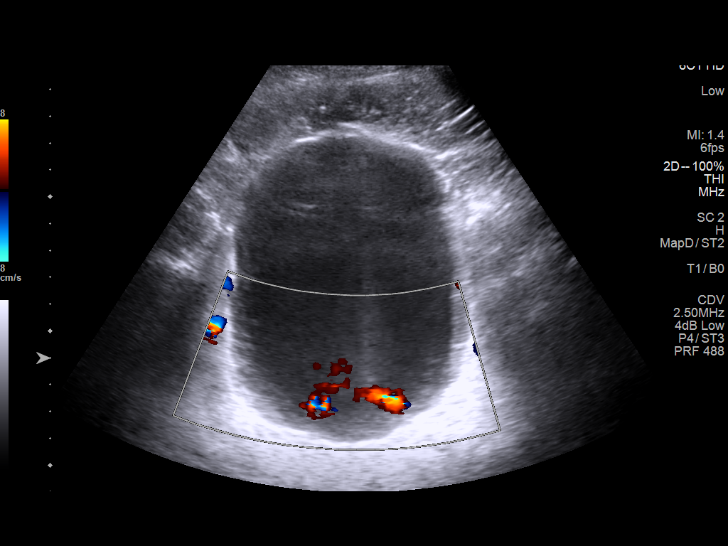

[14 of 25 positions shown; findings below may reference images not displayed]

FINDINGS: Right Kidney:

Renal measurements: 10.5 x 5 x 5.7 cm = volume: 158.2 mL.
Echogenicity within normal limits. No mass or hydronephrosis
visualized.

Left Kidney:

Renal measurements: 11.5 x 5.5 x 6.3 cm = volume: 210.4 mL.
Echogenicity within normal limits. No mass or hydronephrosis
visualized.

Bladder:

Appears normal for degree of bladder distention.

Other:

None.
IMPRESSION: No renal calculi or other abnormality identified.

## 2022-04-19 ENCOUNTER — Ambulatory Visit: Payer: 59 | Admitting: Adult Health

## 2022-04-26 ENCOUNTER — Telehealth (INDEPENDENT_AMBULATORY_CARE_PROVIDER_SITE_OTHER): Payer: 59 | Admitting: Adult Health

## 2022-04-26 DIAGNOSIS — F5104 Psychophysiologic insomnia: Secondary | ICD-10-CM

## 2022-04-26 DIAGNOSIS — F419 Anxiety disorder, unspecified: Secondary | ICD-10-CM | POA: Diagnosis not present

## 2022-04-26 NOTE — Progress Notes (Signed)
PATIENT: Mark Christian DOB: [birth year]  REASON FOR VISIT: follow up HISTORY FROM: patient  Virtual Visit via Video Note  I connected with Mark Christian on 04/26/22 at  1:00 PM EDT by a video enabled telemedicine application located remotely at [facility] and verified that I am speaking with the correct person using two identifiers who was located at their own home.   I discussed the limitations of evaluation and management by telemedicine and the availability of in person appointments. The patient expressed understanding and agreed to proceed.   PATIENT: Mark Christian DOB: 1970/10/27  REASON FOR VISIT: follow up HISTORY FROM: patient  HISTORY OF PRESENT ILLNESS: Today 04/26/22: [name] is a 51 year old male with a history of anxiety and insomnia.  He returns today for follow-up.  He is not sure how much Celexa has helped but his anxiety has improved.  He does want to get off this medication in the future.  He retires in 18 months.  He states that he knows once he retires his anxiety will be eliminated.  He continues to use Ambien 5 mg 2-3 times a week to help with sleep.  He returns today for an evaluation.   REVIEW OF SYSTEMS: Out of a complete 14 system review of symptoms, the patient complains only of the following symptoms, and all other reviewed systems are negative.  ALLERGIES: Allergies  Allergen Reactions   Amoxicillin-Pot Clavulanate     Stomach upset    HOME MEDICATIONS: Outpatient Medications Prior to Visit  Medication Sig Dispense Refill   acetaminophen (TYLENOL) 500 MG tablet Take 2 tablets (1,000 mg total) by mouth every 8 (eight) hours as needed. 30 tablet 0   cetirizine-pseudoephedrine (ZYRTEC-D) 5-120 MG tablet Take 1 tablet by mouth 2 (two) times daily as needed for allergies.     citalopram (CELEXA) 10 MG tablet TAKE 1 TABLET BY MOUTH EVERY DAY (Patient taking differently: Take 10 mg by mouth daily.) 30 tablet 11    FLUDROCORTISONE ACETATE PO Take 0.1 mg by mouth daily.     hydrocortisone (CORTEF) 10 MG tablet Take 10-20 mg by mouth 2 (two) times daily. 20 mg in the morning, 10 mg in the evening     levothyroxine (SYNTHROID, LEVOTHROID) 150 MCG tablet Take 150 mcg by mouth daily. Brand name Synthroid     oxyCODONE (ROXICODONE) 5 MG immediate release tablet Take 1 tablet (5 mg total) by mouth every 6 (six) hours as needed for breakthrough pain. 15 tablet 0   polyethylene glycol (MIRALAX) 17 g packet Take 17 g by mouth daily as needed for mild constipation. 14 each 0   Turmeric (QC TUMERIC COMPLEX PO) Take 1 capsule by mouth daily.     zolpidem (AMBIEN) 5 MG tablet TAKE 1 TABLET BY MOUTH EVERY DAY AT BEDTIME AS NEEDED FOR SLEEP (Patient taking differently: Take 2.5-5 mg by mouth at bedtime as needed for sleep.) 30 tablet 2   No facility-administered medications prior to visit.    PAST MEDICAL HISTORY: Past Medical History:  Diagnosis Date   Addison's disease (HCC)    dx'd in 2004. h/o adrenal crisis in 2004   Anxiety    GERD (gastroesophageal reflux disease)    OTC as needed   Hypothyroidism    Addison's Disease   Right inguinal hernia    Seasonal allergies     PAST SURGICAL HISTORY: Past Surgical History:  Procedure Laterality Date   INGUINAL HERNIA REPAIR Right 12/07/2017   Procedure:  RIGHT HERNIA REPAIR INGUINAL ADULT WITH MESH;  Surgeon: Rolm Bookbinder, MD;  Location: [place];  Service: General;  Laterality: Right;  GENERAL WITH TAP BLOCK   INSERTION OF MESH Right 12/07/2017   Procedure: INSERTION OF MESH;  Surgeon: Rolm Bookbinder, MD;  Location: [place];  Service: General;  Laterality: Right;  GENERAL WITH TAP BLOCK   LAPAROSCOPIC APPENDECTOMY N/A 02/21/2022   Procedure: APPENDECTOMY LAPAROSCOPIC;  Surgeon: Donnie Mesa, MD;  Location: [place];  Service: General;  Laterality: N/A;   RHINOPLASTY  1970s     FAMILY HISTORY: Family History  Problem  Relation Age of Onset   Hypertension Mother    Hypertension Father    Parkinson's disease Maternal Grandfather    Dementia Paternal Grandmother    Colon cancer Neg Hx     SOCIAL HISTORY: Social History   Socioeconomic History   Marital status: Married    Spouse name: Not on file   Number of children: Not on file   Years of education: Not on file   Highest education level: Not on file  Occupational History   Not on file  Tobacco Use   Smoking status: Never   Smokeless tobacco: Never  Vaping Use   Vaping Use: Never used  Substance and Sexual Activity   Alcohol use: Yes    Comment: occassional- once a week   Drug use: No   Sexual activity: Yes  Other Topics Concern   Not on file  Social History Narrative   Engineer, structural. Daily caffeine use - 1. Pt gets regular exercise.    Social Determinants of Health   Financial Resource Strain: Not on file  Food Insecurity: Not on file  Transportation Needs: Not on file  Physical Activity: Not on file  Stress: Not on file  Social Connections: Not on file  Intimate Partner Violence: Not on file      PHYSICAL EXAM Generalized: Well developed, in no acute distress   Neurological examination  Mentation: Alert oriented to time, place, history taking. Follows all commands speech and language fluent Cranial nerve II-XII:Extraocular movements were full. Facial symmetry noted.  Head turning and shoulder shrug  were normal and symmetric.  Reflexes: UTA  DIAGNOSTIC DATA (LABS, IMAGING, TESTING) - I reviewed patient records, labs, notes, testing and imaging myself where available.  Lab Results  Component Value Date   WBC 10.8 (H) 02/21/2022   HGB 15.6 02/21/2022   HCT 48.2 02/21/2022   MCV 80.2 02/21/2022   PLT 305 02/21/2022      Component Value Date/Time   NA 140 02/21/2022 0844   K 4.0 02/21/2022 0844   CL 100 02/21/2022 0844   CO2 31 02/21/2022 0844   GLUCOSE 98 02/21/2022 0844   BUN 17 02/21/2022 0844   CREATININE  1.33 (H) 02/21/2022 0844   CALCIUM 10.2 02/21/2022 0844   PROT 7.1 02/21/2022 0844   ALBUMIN 4.6 02/21/2022 0844   AST 15 02/21/2022 0844   ALT 17 02/21/2022 0844   ALKPHOS 59 02/21/2022 0844   BILITOT 0.8 02/21/2022 0844   GFRNONAA >60 02/21/2022 0844   GFRAA >90 11/08/2011 0450     ASSESSMENT AND PLAN 52 y.o. year old male  has a past medical history of Addison's disease ([place]), Anxiety, GERD (gastroesophageal reflux disease), Hypothyroidism, Right inguinal hernia, and Seasonal allergies. here with:  1.  Anxiety  Continue Celexa 10 mg daily.  Advised that if he decides he wants to wean off this medication he can decrease  his dose of 5 mg daily and call for further instructions  2.  Insomnia  Continue Ambien 5 mg daily as needed   Follow-up in 1 year or sooner if needed     Butch Penny, MSN, NP-C 04/26/2022, 12:59 PM [facility] [address], [address] [place], [place] [address] [phone]

## 2022-05-07 ENCOUNTER — Other Ambulatory Visit: Payer: Self-pay | Admitting: Neurology

## 2022-08-10 ENCOUNTER — Other Ambulatory Visit: Payer: Self-pay | Admitting: Neurology

## 2022-10-09 ENCOUNTER — Other Ambulatory Visit: Payer: Self-pay | Admitting: Neurology

## 2023-04-21 ENCOUNTER — Telehealth (INDEPENDENT_AMBULATORY_CARE_PROVIDER_SITE_OTHER): Payer: 59 | Admitting: Adult Health

## 2023-04-21 DIAGNOSIS — G4700 Insomnia, unspecified: Secondary | ICD-10-CM | POA: Diagnosis not present

## 2023-04-21 DIAGNOSIS — F419 Anxiety disorder, unspecified: Secondary | ICD-10-CM

## 2023-04-21 DIAGNOSIS — F5104 Psychophysiologic insomnia: Secondary | ICD-10-CM

## 2023-04-21 NOTE — Progress Notes (Signed)
PATIENT: Mark Christian DOB: 17-May-1971  REASON FOR VISIT: follow up HISTORY FROM: patient  Virtual Visit via Video Note  I connected with Mark Christian on 04/21/23 at 10:30 AM EDT by a video enabled telemedicine application located remotely at [facility] and verified that I am speaking with the correct person using two identifiers who was located at their own home.   I discussed the limitations of evaluation and management by telemedicine and the availability of in person appointments. The patient expressed understanding and agreed to proceed.   PATIENT: Mark Christian DOB: Nov 16, 1970  REASON FOR VISIT: follow up HISTORY FROM: patient  HISTORY OF PRESENT ILLNESS: Today 04/21/23:  Mark Christian is a 52 y.o. male with a history of anxiety and insomnia. Returns today for follow-up.  At the last visit we discussed potentially weaning off Celexa.  He states that he was able to reduce to 5 mg daily.  He is not sure how much is actually helping.Continues to take Ambien 5 mg 2-3 times a week to help with sleep.  Plans to retire from his job neck September    04/26/22: Mark Christian is a 52 year old male with a history of anxiety and insomnia.  He returns today for follow-up.  He is not sure how much Celexa has helped but his anxiety has improved.  He does want to get off this medication in the future.  He retires in 18 months.  He states that he knows once he retires his anxiety will be eliminated.  He continues to use Ambien 5 mg 2-3 times a week to help with sleep.  He returns today for an evaluation.   REVIEW OF SYSTEMS: Out of a complete 14 system review of symptoms, the patient complains only of the following symptoms, and all other reviewed systems are negative.  ALLERGIES: Allergies  Allergen Reactions   Amoxicillin-Pot Clavulanate     Stomach upset    HOME MEDICATIONS: Outpatient Medications Prior to Visit  Medication Sig Dispense Refill    acetaminophen (TYLENOL) 500 MG tablet Take 2 tablets (1,000 mg total) by mouth every 8 (eight) hours as needed. 30 tablet 0   cetirizine-pseudoephedrine (ZYRTEC-D) 5-120 MG tablet Take 1 tablet by mouth 2 (two) times daily as needed for allergies.     citalopram (CELEXA) 10 MG tablet TAKE 1 TABLET BY MOUTH EVERY DAY 30 tablet 11   FLUDROCORTISONE ACETATE PO Take 0.1 mg by mouth daily.     hydrocortisone (CORTEF) 10 MG tablet Take 10-20 mg by mouth 2 (two) times daily. 20 mg in the morning, 10 mg in the evening     levothyroxine (SYNTHROID, LEVOTHROID) 150 MCG tablet Take 150 mcg by mouth daily. Brand name Synthroid     oxyCODONE (ROXICODONE) 5 MG immediate release tablet Take 1 tablet (5 mg total) by mouth every 6 (six) hours as needed for breakthrough pain. 15 tablet 0   polyethylene glycol (MIRALAX) 17 g packet Take 17 g by mouth daily as needed for mild constipation. 14 each 0   Turmeric (QC TUMERIC COMPLEX PO) Take 1 capsule by mouth daily.     zolpidem (AMBIEN) 5 MG tablet TAKE 1 TABLET (5 MG TOTAL) BY MOUTH EVERY DAY AT BEDTIME AS NEEDED FOR SLEEP 30 tablet 5   No facility-administered medications prior to visit.    PAST MEDICAL HISTORY: Past Medical History:  Diagnosis Date   Addison's disease (HCC)    dx'd in 2004. h/o adrenal crisis in  2004   Anxiety    GERD (gastroesophageal reflux disease)    OTC as needed   Hypothyroidism    Addison's Disease   Right inguinal hernia    Seasonal allergies     PAST SURGICAL HISTORY: Past Surgical History:  Procedure Laterality Date   INGUINAL HERNIA REPAIR Right 12/07/2017   Procedure: RIGHT HERNIA REPAIR INGUINAL ADULT WITH MESH;  Surgeon: Emelia Loron, MD;  Location: [facility];  Service: General;  Laterality: Right;  GENERAL WITH TAP BLOCK   INSERTION OF MESH Right 12/07/2017   Procedure: INSERTION OF MESH;  Surgeon: Emelia Loron, MD;  Location: [facility];  Service: General;  Laterality: Right;   GENERAL WITH TAP BLOCK   LAPAROSCOPIC APPENDECTOMY N/A 02/21/2022   Procedure: APPENDECTOMY LAPAROSCOPIC;  Surgeon: Manus Rudd, MD;  Location: [facility];  Service: General;  Laterality: N/A;   RHINOPLASTY  1970s     FAMILY HISTORY: Family History  Problem Relation Age of Onset   Hypertension Mother    Hypertension Father    Parkinson's disease Maternal Grandfather    Dementia Paternal Grandmother    Colon cancer Neg Hx     SOCIAL HISTORY: Social History   Socioeconomic History   Marital status: Married    Spouse name: Not on file   Number of children: Not on file   Years of education: Not on file   Highest education level: Not on file  Occupational History   Not on file  Tobacco Use   Smoking status: Never   Smokeless tobacco: Never  Vaping Use   Vaping status: Never Used  Substance and Sexual Activity   Alcohol use: Yes    Comment: occassional- once a week   Drug use: No   Sexual activity: Yes  Other Topics Concern   Not on file  Social History Narrative   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 Pt gets regular exercise.    Social Determinants of Health   Financial Resource Strain: Not on file  Food Insecurity: Not on file  Transportation Needs: Not on file  Physical Activity: Not on file  Stress: Not on file  Social Connections: Not on file  Intimate Partner Violence: Not on file      PHYSICAL EXAM Generalized: Well developed, in no acute distress   Neurological examination  Mentation: Alert oriented to time, place, history taking. Follows all commands speech and language fluent Cranial nerve II-XII:Extraocular movements were full. Facial symmetry noted.  Head turning and shoulder shrug  were normal and symmetric.  Reflexes: UTA  DIAGNOSTIC DATA (LABS, IMAGING, TESTING) - I reviewed patient records, labs, notes, testing and imaging myself where available.  Lab Results  Component Value Date   WBC 10.8 (H) 02/21/2022   HGB 15.6 02/21/2022   HCT  48.2 02/21/2022   MCV 80.2 02/21/2022   PLT 305 02/21/2022      Component Value Date/Time   NA 140 02/21/2022 0844   K 4.0 02/21/2022 0844   CL 100 02/21/2022 0844   CO2 31 02/21/2022 0844   GLUCOSE 98 02/21/2022 0844   BUN 17 02/21/2022 0844   CREATININE 1.33 (H) 02/21/2022 0844   CALCIUM 10.2 02/21/2022 0844   PROT 7.1 02/21/2022 0844   ALBUMIN 4.6 02/21/2022 0844   AST 15 02/21/2022 0844   ALT 17 02/21/2022 0844   ALKPHOS 59 02/21/2022 0844   BILITOT 0.8 02/21/2022 0844   GFRNONAA >60 02/21/2022 0844   GFRAA >90 11/08/2011 0450  ASSESSMENT AND PLAN 52 y.o. year old male  has a past medical history of Addison's disease (HCC), Anxiety, GERD (gastroesophageal reflux disease), Hypothyroidism, Right inguinal hernia, and Seasonal allergies. here with:  1.  Anxiety  Decrease Celexa to 5 mg every other day for 1 week then discontinue the medicine.  Advised that if he feels that his anxiety worsens he could restart the medication and let us know.  2.  Insomnia  Continue Ambien 5 mg daily as needed   Follow-up in 1 year or sooner if needed     Butch Penny, MSN, NP-C 04/21/2023, 10:27 AM [facility] [address], [address] [place], [place] [address] [phone]

## 2023-05-06 ENCOUNTER — Other Ambulatory Visit: Payer: Self-pay | Admitting: Adult Health

## 2023-05-14 ENCOUNTER — Other Ambulatory Visit: Payer: Self-pay | Admitting: Neurology

## 2023-09-02 ENCOUNTER — Other Ambulatory Visit: Payer: Self-pay | Admitting: Adult Health

## 2023-09-04 ENCOUNTER — Other Ambulatory Visit: Payer: Self-pay

## 2023-09-07 NOTE — Telephone Encounter (Deleted)
Checked chart. Last seen

## 2023-12-17 ENCOUNTER — Other Ambulatory Visit: Payer: Self-pay | Admitting: Adult Health

## 2023-12-18 ENCOUNTER — Other Ambulatory Visit: Payer: Self-pay

## 2023-12-19 ENCOUNTER — Other Ambulatory Visit: Payer: Self-pay | Admitting: Adult Health

## 2023-12-19 MED ORDER — ZOLPIDEM TARTRATE 5 MG PO TABS: 5.0000 mg | ORAL_TABLET | Freq: Every evening | ORAL | 1 refills | Status: AC | PRN

## 2023-12-19 NOTE — Telephone Encounter (Signed)
 I did not see on Bernardine Bridegroom 's desk, I called pharmacy and no rx faxed received of filled.  I sent another request to MM/NP and she approved.  Printed and faxed to [facility] [phone], received confirmation received.

## 2023-12-19 NOTE — Addendum Note (Signed)
 Addended by: Viktoria Gray on: 12/19/2023 03:33 PM   Modules accepted: Orders

## 2023-12-19 NOTE — Telephone Encounter (Signed)
 Looks like this was printed? Do I need to sign or resend?

## 2024-04-05 ENCOUNTER — Other Ambulatory Visit: Payer: Self-pay

## 2024-04-05 ENCOUNTER — Other Ambulatory Visit: Payer: Self-pay | Admitting: Adult Health

## 2024-04-10 ENCOUNTER — Other Ambulatory Visit: Payer: Self-pay | Admitting: *Deleted

## 2024-04-10 NOTE — Telephone Encounter (Signed)
 Last seen 04-21-2023, next appt 04-23-2024 last fill #30 02-16-2024

## 2024-04-15 ENCOUNTER — Emergency Department (HOSPITAL_BASED_OUTPATIENT_CLINIC_OR_DEPARTMENT_OTHER)

## 2024-04-15 ENCOUNTER — Encounter (HOSPITAL_BASED_OUTPATIENT_CLINIC_OR_DEPARTMENT_OTHER): Payer: Self-pay

## 2024-04-15 ENCOUNTER — Other Ambulatory Visit: Payer: Self-pay

## 2024-04-15 ENCOUNTER — Emergency Department (HOSPITAL_BASED_OUTPATIENT_CLINIC_OR_DEPARTMENT_OTHER)
Admission: EM | Admit: 2024-04-15 | Discharge: 2024-04-15 | Disposition: A | Discharge status: Home / Self Care | Attending: Emergency Medicine | Admitting: Emergency Medicine

## 2024-04-15 DIAGNOSIS — R1032 Left lower quadrant pain: Secondary | ICD-10-CM | POA: Diagnosis present

## 2024-04-15 DIAGNOSIS — K5792 Diverticulitis of intestine, part unspecified, without perforation or abscess without bleeding: Secondary | ICD-10-CM

## 2024-04-15 DIAGNOSIS — R000 Tachycardia, unspecified: Secondary | ICD-10-CM | POA: Insufficient documentation

## 2024-04-15 DIAGNOSIS — K5732 Diverticulitis of large intestine without perforation or abscess without bleeding: Secondary | ICD-10-CM | POA: Diagnosis not present

## 2024-04-15 LAB — COMPREHENSIVE METABOLIC PANEL WITH GFR
ALT: 19 U/L (ref 0–44)
AST: 21 U/L (ref 15–41)
Albumin: 4.2 g/dL (ref 3.5–5.0)
Alkaline Phosphatase: 78 U/L (ref 38–126)
Anion gap: 12 (ref 5–15)
BUN: 14 mg/dL (ref 6–20)
CO2: 27 mmol/L (ref 22–32)
Calcium: 9.6 mg/dL (ref 8.9–10.3)
Chloride: 100 mmol/L (ref 98–111)
Creatinine, Ser: 1.3 mg/dL — ABNORMAL HIGH (ref 0.61–1.24)
GFR, Estimated: >60 mL/min (ref >60)
Glucose, Bld: 98 mg/dL (ref 70–99)
Potassium: 3.8 mmol/L (ref 3.5–5.1)
Sodium: 139 mmol/L (ref 135–145)
Total Bilirubin: 0.5 mg/dL (ref 0.0–1.2)
Total Protein: 7.5 g/dL (ref 6.5–8.1)

## 2024-04-15 LAB — CBC
HCT: 45.9 % (ref 39.0–52.0)
Hemoglobin: 14.9 g/dL (ref 13.0–17.0)
MCH: 25.6 pg — ABNORMAL LOW (ref 26.0–34.0)
MCHC: 32.5 g/dL (ref 30.0–36.0)
MCV: 78.9 fL — ABNORMAL LOW (ref 80.0–100.0)
Platelets: 311 K/uL (ref 150–400)
RBC: 5.82 MIL/uL — ABNORMAL HIGH (ref 4.22–5.81)
RDW: 14.3 % (ref 11.5–15.5)
WBC: 12.2 K/uL — ABNORMAL HIGH (ref 4.0–10.5)
nRBC: 0 % (ref 0.0–0.2)

## 2024-04-15 LAB — URINALYSIS, ROUTINE W REFLEX MICROSCOPIC
Bacteria, UA: NONE SEEN
Bilirubin Urine: NEGATIVE
Glucose, UA: NEGATIVE mg/dL
Ketones, ur: NEGATIVE mg/dL
Leukocytes,Ua: NEGATIVE
Nitrite: NEGATIVE
Protein, ur: NEGATIVE mg/dL
Specific Gravity, Urine: 1.01 (ref 1.005–1.030)
pH: 6.5 (ref 5.0–8.0)

## 2024-04-15 LAB — LACTIC ACID, PLASMA: Lactic Acid, Venous: 1 mmol/L (ref 0.5–1.9)

## 2024-04-15 LAB — LIPASE, BLOOD: Lipase: 47 U/L (ref 11–51)

## 2024-04-15 MED ORDER — IOHEXOL 300 MG/ML  SOLN
100.0000 mL | Freq: Once | INTRAMUSCULAR | Status: AC | PRN
  Administered 2024-04-15: 100 mL via INTRAVENOUS

## 2024-04-15 MED ORDER — METRONIDAZOLE 500 MG PO TABS
500.0000 mg | ORAL_TABLET | Freq: Once | ORAL | Status: AC
  Administered 2024-04-15: 500 mg via ORAL
  Filled 2024-04-15: qty 1

## 2024-04-15 MED ORDER — CIPROFLOXACIN HCL 500 MG PO TABS
500.0000 mg | ORAL_TABLET | Freq: Once | ORAL | Status: AC
  Administered 2024-04-15: 500 mg via ORAL
  Filled 2024-04-15: qty 1

## 2024-04-15 MED ORDER — CIPROFLOXACIN HCL 500 MG PO TABS: 500.0000 mg | ORAL_TABLET | Freq: Two times a day (BID) | ORAL | 0 refills | Status: AC

## 2024-04-15 MED ORDER — METRONIDAZOLE 500 MG PO TABS: 500.0000 mg | ORAL_TABLET | Freq: Three times a day (TID) | ORAL | 0 refills | Status: AC

## 2024-04-15 NOTE — ED Triage Notes (Signed)
 Pt presents via POV c/o lower abd pain and bloating. Denies bloody or dark stools. Denies dysuria. Reports has been occurring over the last couple of day.

## 2024-04-15 NOTE — Discharge Instructions (Addendum)
 Return to the emergency department if your symptoms worsen.  Contact your primary care provider to schedule follow-up in regard to your diverticulitis. Start ciprofloxacin , 1 tablet by mouth twice daily for 7 days.  Start Flagyl , 1 tablet by mouth 3 times daily for 7 days.  You will likely need to follow-up with a gastroenterologist once your diverticulitis has resolved, I have provided you with the contact information for [facility] gastroenterology to schedule follow-up.

## 2024-04-15 NOTE — ED Provider Notes (Signed)
 [place] EMERGENCY DEPARTMENT AT [facility] Provider Note   CSN: [id number] Arrival date & time: 04/15/24  8062     Patient presents with: Abdominal Pain   Mark Christian is a 53 y.o. male.   53 year old male presenting with abdominal pain.  Symptoms have been ongoing for several days but are worse today, describes sharp pain primarily to left lower quadrant with some radiation to left flank, did have 1 bout of loose stool today but otherwise denies diarrhea, did endorse chills yesterday but no known fever.  Denies vomiting, dysuria, chest pain, shortness of breath. History of Addison's disease, on steroids chronically.   Abdominal Pain      Prior to Admission medications   Medication Sig Start Date End Date Taking? Authorizing Provider  acetaminophen  (TYLENOL ) 500 MG tablet Take 2 tablets (1,000 mg total) by mouth every 8 (eight) hours as needed. 02/21/22   Maczis, Michael M, PA-C  cetirizine-pseudoephedrine (ZYRTEC-D) 5-120 MG tablet Take 1 tablet by mouth 2 (two) times daily as needed for allergies.    [provider]  citalopram  (CELEXA ) 10 MG tablet TAKE 1 TABLET BY MOUTH EVERY DAY 05/15/23   Dohmeier, Dedra, MD  FLUDROCORTISONE ACETATE PO Take 0.1 mg by mouth daily.    [provider]  hydrocortisone  (CORTEF ) 10 MG tablet Take 10-20 mg by mouth 2 (two) times daily. 20 mg in the morning, 10 mg in the evening    [provider]  levothyroxine (SYNTHROID, LEVOTHROID) 150 MCG tablet Take 150 mcg by mouth daily. Brand name Synthroid    [provider]  oxyCODONE  (ROXICODONE ) 5 MG immediate release tablet Take 1 tablet (5 mg total) by mouth every 6 (six) hours as needed for breakthrough pain. 02/21/22   Maczis, Michael M, PA-C  polyethylene glycol (MIRALAX ) 17 g packet Take 17 g by mouth daily as needed for mild constipation. 02/21/22   Maczis, Michael M, PA-C  Turmeric (QC TUMERIC COMPLEX PO) Take 1 capsule by mouth daily.    [provider]  zolpidem  (AMBIEN ) 5 MG tablet Take 1 tablet (5 mg total) by mouth at bedtime as needed for sleep. 12/19/23   Millikan, Megan, NP    Allergies: Amoxicillin-pot clavulanate    Review of Systems  Gastrointestinal:  Positive for abdominal pain.    Updated Vital Signs  Vitals:   04/15/24 2018 04/15/24 2303  BP: (!) 140/90 139/83  Pulse: 100 92  Resp: 18 18  Temp: 99 F (37.2 C) 99.2 F (37.3 C)  TempSrc:  Oral  SpO2: 100% 100%     Physical Exam Vitals and nursing note reviewed.  HENT:     Head: Normocephalic.  Eyes:     Extraocular Movements: Extraocular movements intact.  Cardiovascular:     Rate and Rhythm: Regular rhythm. Tachycardia present.  Pulmonary:     Effort: Pulmonary effort is normal.     Breath sounds: Normal breath sounds.  Abdominal:     Palpations: Abdomen is soft.     Tenderness: There is abdominal tenderness (LLQ and suprapubic region). There is no right CVA tenderness, left CVA tenderness or guarding.  Musculoskeletal:     Cervical back: Normal range of motion.     Comments: Moves all extremities spontaneously without difficulty  Skin:    General: Skin is warm and dry.  Neurological:     Mental Status: He is alert and oriented to person, place, and time.     (all labs ordered are listed, but only abnormal  results are displayed) Labs Reviewed  COMPREHENSIVE METABOLIC PANEL WITH GFR - Abnormal; Notable for the following components:      Result Value   Creatinine, Ser 1.30 (*)    All other components within normal limits  CBC - Abnormal; Notable for the following components:   WBC 12.2 (*)    RBC 5.82 (*)    MCV 78.9 (*)    MCH 25.6 (*)    All other components within normal limits  URINALYSIS, ROUTINE W REFLEX MICROSCOPIC - Abnormal; Notable for the following components:   Color, Urine COLORLESS (*)    Hgb urine dipstick TRACE (*)    All other components within normal limits  CULTURE, BLOOD (ROUTINE X 2)  CULTURE, BLOOD  (ROUTINE X 2)  LIPASE, BLOOD  LACTIC ACID, PLASMA    EKG: None  Radiology: CT ABDOMEN PELVIS W CONTRAST Result Date: 04/15/2024 CLINICAL DATA:  Left lower quadrant pain EXAM: CT ABDOMEN AND PELVIS WITH CONTRAST TECHNIQUE: Multidetector CT imaging of the abdomen and pelvis was performed using the standard protocol following bolus administration of intravenous contrast. RADIATION DOSE REDUCTION: This exam was performed according to the departmental dose-optimization program which includes automated exposure control, adjustment of the mA and/or kV according to patient size and/or use of iterative reconstruction technique. CONTRAST:  OMNIPAQUE  IOHEXOL  300 MG/ML  SOLN COMPARISON:  CT 02/21/2022 FINDINGS: Lower chest: Lung bases are clear Hepatobiliary: No focal liver abnormality is seen. No gallstones, gallbladder wall thickening, or biliary dilatation. Pancreas: Unremarkable. No pancreatic ductal dilatation or surrounding inflammatory changes. Spleen: Normal in size without focal abnormality. Adrenals/Urinary Tract: Adrenal glands are normal. Kidneys show no hydronephrosis. Small nonobstructing right kidney stone measuring up to 4 mm. The bladder is unremarkable Stomach/Bowel: Stomach within normal limits. No dilated small bowel. Appendectomy. Diverticular disease of the colon. Focal wall thickening at the proximal sigmoid colon with moderate soft tissue stranding. No perforation or abscess. Vascular/Lymphatic: No significant vascular findings are present. No enlarged abdominal or pelvic lymph nodes. Reproductive: Prostate is unremarkable. Other: Negative for pelvic effusion or free air Musculoskeletal: No acute or suspicious osseous abnormality IMPRESSION: 1. Findings suspect for acute diverticulitis of the proximal sigmoid colon. No perforation or abscess. Colonoscopy follow-up after resolution of acute symptoms is recommended if not already performed to exclude colon lesion as cause for the prominent  wall thickening at the sigmoid colon. 2. Nonobstructing right kidney stone. Electronically Signed   By: Luke Bun M.D.   On: 04/15/2024 22:37     Procedures   Medications Ordered in the ED  metroNIDAZOLE  (FLAGYL ) tablet 500 mg (has no administration in time range)  ciprofloxacin  (CIPRO ) tablet 500 mg (has no administration in time range)  iohexol  (OMNIPAQUE ) 300 MG/ML solution 100 mL (100 mLs Intravenous Contrast Given 04/15/24 2214)                                    Medical Decision Making This patient presents to the ED for concern of abdominal pain, this involves an extensive number of treatment options, and is a complaint that carries with it a high risk of complications and morbidity.  The differential diagnosis includes diverticulitis, gastroenteritis, pancreatitis, urinary tract infection, cholecystitis   Co morbidities that complicate the patient evaluation  Adrenal insufficiency, on steroids chronically   Additional history obtained:  Additional history obtained from record review External records from outside source obtained and reviewed including prior PCP note (  [facility])   Lab Tests:  I Ordered, and personally interpreted labs.  The pertinent results include: CBC notable for leukocytosis with white blood cell count of 12.2, otherwise largely unremarkable.  CMP notable for creatinine of 1.3, this is stable as compared to his baseline of 1.2 that was noted in June at his PCP office.  Lipase within normal limits.  Urinalysis notable for trace RBCs, otherwise unremarkable.  Lactic normal, 1.0.   Imaging Studies ordered:  I ordered imaging studies including CT abdomen/pelvis I independently visualized and interpreted imaging which showed 1. Findings suspect for acute diverticulitis of the proximal sigmoid colon. No perforation or abscess. Colonoscopy follow-up after resolution of acute symptoms is recommended if not already performed to exclude colon lesion as  cause for the prominent wall thickening at the sigmoid colon. 2. Nonobstructing right kidney stone.  I agree with the radiologist interpretation   Cardiac Monitoring: / EKG:  The patient was maintained on a cardiac monitor.  I personally viewed and interpreted the cardiac monitored which showed an underlying rhythm of: NSR  Problem List / ED Course / Critical interventions / Medication management  I ordered medication including Cipro  and Flagyl  for diverticulitis I have reviewed the patients home medicines and have made adjustments as needed  Test / Admission - Considered:  Physical exam is notable as above.  CT results are notable for diverticulitis without abscess formation, he was also found to have right sided nephrolithiasis however this is not obstructing and I do not feel that it is contributing to his symptoms today, as his abdominal pain is left-sided.  Labs are generally reassuring, patient does have leukocytosis with white count of 12.2 however no fever, lactic normal, otherwise reassuring physical exam findings.  Given that patient is immunosuppressed with history of Addison's disease/chronic steroid use, will proceed with antibiotic treatment including Cipro  and Flagyl  x 7 days. Patient is allergic to Augmentin.  I encouraged the patient to follow-up with his PCP at [facility], as he may benefit from stress dose steroids given new diagnosis of diverticulitis, and he will likely require follow-up with gastroenterology at some point.  First dose of antibiotics were given in the emergency department this evening.  Strict return precautions discussed, patient understands that he is at high risk for exacerbation of his illness due to the chronic health conditions stated above.  Patient and his wife are in agreement with this plan.  He is appropriate for discharge at this time.    Amount and/or Complexity of Data Reviewed Labs: ordered. Radiology: ordered.  Risk Prescription drug  management.        Final diagnoses:  Diverticulitis    ED Discharge Orders          Ordered    ciprofloxacin  (CIPRO ) 500 MG tablet  Every 12 hours        04/15/24 2339    metroNIDAZOLE  (FLAGYL ) 500 MG tablet  3 times daily        04/15/24 2339               Glendia Rocky SAILOR, [place] 04/15/24 2345    Dreama Rocky, MD 04/16/24 1253

## 2024-04-21 LAB — CULTURE, BLOOD (ROUTINE X 2)
Culture: NO GROWTH
Culture: NO GROWTH
Special Requests: ADEQUATE
Special Requests: ADEQUATE

## 2024-04-23 ENCOUNTER — Telehealth: Payer: 59 | Admitting: Adult Health
